# Patient Record
Sex: Female | Born: 1955 | Race: Black or African American | Hispanic: No | Smoking: Current every day smoker
Health system: Southern US, Community
[De-identification: ages and names within clinical notes are randomized; demographics above are authoritative.]

## PROBLEM LIST (undated history)

## (undated) DIAGNOSIS — T7840XA Allergy, unspecified, initial encounter: Secondary | ICD-10-CM

## (undated) DIAGNOSIS — J449 Chronic obstructive pulmonary disease, unspecified: Secondary | ICD-10-CM

## (undated) DIAGNOSIS — R011 Cardiac murmur, unspecified: Secondary | ICD-10-CM

## (undated) DIAGNOSIS — R0789 Other chest pain: Secondary | ICD-10-CM

## (undated) DIAGNOSIS — I10 Essential (primary) hypertension: Secondary | ICD-10-CM

## (undated) DIAGNOSIS — K219 Gastro-esophageal reflux disease without esophagitis: Secondary | ICD-10-CM

## (undated) DIAGNOSIS — E785 Hyperlipidemia, unspecified: Secondary | ICD-10-CM

## (undated) DIAGNOSIS — H269 Unspecified cataract: Secondary | ICD-10-CM

## (undated) HISTORY — PX: CHOLECYSTECTOMY: SHX55

## (undated) HISTORY — DX: Other chest pain: R07.89

## (undated) HISTORY — DX: Hyperlipidemia, unspecified: E78.5

## (undated) HISTORY — DX: Cardiac murmur, unspecified: R01.1

## (undated) HISTORY — DX: Chronic obstructive pulmonary disease, unspecified: J44.9

## (undated) HISTORY — DX: Unspecified cataract: H26.9

## (undated) HISTORY — DX: Allergy, unspecified, initial encounter: T78.40XA

---
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
## 1979-04-25 HISTORY — PX: ABDOMINAL HYSTERECTOMY: SHX81

## 1999-01-11 ENCOUNTER — Emergency Department (HOSPITAL_COMMUNITY): Admission: EM | Admit: 1999-01-11 | Discharge: 1999-01-11 | Payer: Self-pay | Admitting: Emergency Medicine

## 1999-04-22 ENCOUNTER — Emergency Department (HOSPITAL_COMMUNITY): Admission: EM | Admit: 1999-04-22 | Discharge: 1999-04-22 | Payer: Self-pay | Admitting: Emergency Medicine

## 1999-05-19 ENCOUNTER — Emergency Department (HOSPITAL_COMMUNITY): Admission: EM | Admit: 1999-05-19 | Discharge: 1999-05-19 | Payer: Self-pay | Admitting: Emergency Medicine

## 1999-11-08 ENCOUNTER — Other Ambulatory Visit: Admission: RE | Admit: 1999-11-08 | Discharge: 1999-11-08 | Payer: Self-pay | Admitting: Family Medicine

## 2000-07-10 ENCOUNTER — Encounter: Payer: Self-pay | Admitting: Family Medicine

## 2000-07-10 ENCOUNTER — Encounter: Admission: RE | Admit: 2000-07-10 | Discharge: 2000-07-10 | Payer: Self-pay | Admitting: Family Medicine

## 2002-08-27 ENCOUNTER — Encounter: Payer: Self-pay | Admitting: Emergency Medicine

## 2002-08-27 ENCOUNTER — Emergency Department (HOSPITAL_COMMUNITY): Admission: EM | Admit: 2002-08-27 | Discharge: 2002-08-27 | Payer: Self-pay | Admitting: Emergency Medicine

## 2006-04-25 ENCOUNTER — Emergency Department (HOSPITAL_COMMUNITY): Admission: EM | Admit: 2006-04-25 | Discharge: 2006-04-25 | Payer: Self-pay | Admitting: Family Medicine

## 2006-08-16 ENCOUNTER — Ambulatory Visit: Payer: Self-pay | Admitting: Obstetrics and Gynecology

## 2006-12-21 ENCOUNTER — Ambulatory Visit: Payer: Self-pay | Admitting: Internal Medicine

## 2006-12-21 ENCOUNTER — Ambulatory Visit: Payer: Self-pay | Admitting: *Deleted

## 2007-12-02 ENCOUNTER — Emergency Department (HOSPITAL_COMMUNITY): Admission: EM | Admit: 2007-12-02 | Discharge: 2007-12-02 | Payer: Self-pay | Admitting: Emergency Medicine

## 2008-10-23 ENCOUNTER — Emergency Department (HOSPITAL_COMMUNITY): Admission: EM | Admit: 2008-10-23 | Discharge: 2008-10-23 | Payer: Self-pay | Admitting: Emergency Medicine

## 2011-04-25 HISTORY — PX: COLONOSCOPY: SHX174

## 2011-08-09 ENCOUNTER — Ambulatory Visit: Payer: Self-pay | Admitting: Family Medicine

## 2011-08-10 ENCOUNTER — Ambulatory Visit: Payer: Self-pay | Admitting: Family Medicine

## 2011-08-18 ENCOUNTER — Encounter: Payer: Self-pay | Admitting: Family Medicine

## 2011-08-18 ENCOUNTER — Ambulatory Visit (INDEPENDENT_AMBULATORY_CARE_PROVIDER_SITE_OTHER): Payer: Self-pay | Admitting: Family Medicine

## 2011-08-18 VITALS — BP 154/91 | HR 89 | Ht 64.0 in | Wt 186.0 lb

## 2011-08-18 DIAGNOSIS — J42 Unspecified chronic bronchitis: Secondary | ICD-10-CM | POA: Insufficient documentation

## 2011-08-18 DIAGNOSIS — R011 Cardiac murmur, unspecified: Secondary | ICD-10-CM

## 2011-08-18 DIAGNOSIS — J309 Allergic rhinitis, unspecified: Secondary | ICD-10-CM | POA: Insufficient documentation

## 2011-08-18 DIAGNOSIS — E669 Obesity, unspecified: Secondary | ICD-10-CM

## 2011-08-18 DIAGNOSIS — R300 Dysuria: Secondary | ICD-10-CM

## 2011-08-18 LAB — POCT URINALYSIS DIPSTICK
Bilirubin, UA: NEGATIVE
Blood, UA: NEGATIVE
Glucose, UA: NEGATIVE
Ketones, UA: NEGATIVE
Leukocytes, UA: NEGATIVE
Nitrite, UA: NEGATIVE
Protein, UA: NEGATIVE
Spec Grav, UA: 1.02
Urobilinogen, UA: 1
pH, UA: 6

## 2011-08-18 MED ORDER — MOMETASONE FUROATE 50 MCG/ACT NA SUSP: 2.0000 | Freq: Every day | NASAL | Status: DC

## 2011-08-18 MED ORDER — METRONIDAZOLE 500 MG PO TABS: 500.0000 mg | ORAL_TABLET | Freq: Two times a day (BID) | ORAL | Status: AC

## 2011-08-18 MED ORDER — METRONIDAZOLE 500 MG PO TABS: 500.0000 mg | ORAL_TABLET | Freq: Two times a day (BID) | ORAL | Status: DC

## 2011-08-18 MED ORDER — ALBUTEROL SULFATE HFA 108 (90 BASE) MCG/ACT IN AERS: 2.0000 | INHALATION_SPRAY | RESPIRATORY_TRACT | Status: DC | PRN

## 2011-08-18 NOTE — Patient Instructions (Addendum)
It was very nice meeting you today!  For the bronchitis, I would like for you to get some testing of your lung function. To do this, you can call the clinic and make an appointment with Dr. Raymondo Band. He will also be able to help you with information about smoking cessation. It's very important for [place] to work on that.  I'm going to send in a prescription for albuterol to take as needed for shortness of breath.  For the nasal congestion and the allergies, I am sending a prescription for nasonex to take daily. You can also take zyrtec (cetirizine) or claritin (loratadine) every day.   For the pain on your right side, you can take aleve up to twice a day.   I'll be getting some blood work to check for cholesterol, kidney function and sugar. For that, call the clinic 1 -2 days before to make a lab appointment and come fasting: no food or drink other than water after midnight.   I'd like to see you back in 3 weeks, so that we can keep an eye on your blood pressure.

## 2011-08-19 NOTE — Progress Notes (Signed)
Patient ID: AARON BOEH, female   DOB: 05-31-1955, 56 y.o.   MRN: [id number]  HPI Sabrina [name] is a 56 y.o. female with h/o hyperlipidemia and chronic bronchitis who presents to establish care. 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 She feels like her shortness of breath has gotten worst in the last year or so. She becomes short of breath after walking for 1 block. She feels better when sitting. She has a chronic, non bloody, productive cough. She is not on any inhaler such as albuterol. She smokes 1.5pack per day and has smoked for 15 years. She denies any chest pain, orthopnea. - smoking cessation: She quit for 2 weeks after using chantix, but was around smokers at home which made quitting difficult. Currently lives with her daughter who smokes intermittently. She expresses the need to quit for her health and her trouble breathing. - allergic rhinitis: has rhinorrhea, sinus congestion. This is usually in relation to seasonal allergies. She doesn't take any medication for it. Was on nasonex in the past which helped her. Denies any headaches. - dysuria and vaginal odor: having some burning with urination. Also complains of fishy odor with urination. Denies any vaginal bleeding, abnormal discharge, vaginal itching or pain. - right flank musculoskeletal pain: pulling, throbbing pain. Feels like a knot. Has been feeling this way since 2010. No history of trauma to the area. No weakness, no loss of sensation, no urinary or bowel incontinence.    Past Medical History  Diagnosis Date  . Allergy   . Chronic bronchitis 1991  . Hyperlipidemia     not on any current medication. Had been on lipitor in the past  . Musculoskeletal chest pain     Past Surgical History  Procedure Date  . Abdominal hysterectomy 1981    removal of right ovary and uterus for infection of the  uterus  . Cholecystectomy     1979    Family History  Problem Relation Age of Onset  . Alzheimer's disease Mother   . Hyperlipidemia Mother   . Hypertension Mother   . Emphysema Father   . Alcohol abuse Sister   . Asthma Brother   . Cervical cancer Daughter   . Sickle cell anemia Other     Social History History  Substance Use Topics  . Smoking status: Current Everyday Smoker -- 1.5 packs/day    Types: Cigarettes  . Smokeless tobacco: Not on file  . Alcohol Use: Not on file    No Known Allergies  Current Outpatient Prescriptions  Medication Sig Dispense Refill  . albuterol (PROVENTIL HFA;VENTOLIN HFA) 108 (90 BASE) MCG/ACT inhaler Inhale 2 puffs into the lungs as needed for wheezing.  1 Inhaler  0  . metroNIDAZOLE (FLAGYL) 500 MG tablet Take 1 tablet (500 mg total) by mouth 2 (two) times daily. Take for 7 days  14 tablet  0  . mometasone (NASONEX) 50 MCG/ACT nasal spray Place 2 sprays into the nose daily.  17 g  12    Review of Systems Review of Systems  HENT: Positive for congestion, rhinorrhea and sneezing. Negative for ear pain.   Eyes: Positive for itching.  Respiratory: Positive for cough, shortness of breath and wheezing. Negative for chest tightness.   Cardiovascular: Negative for chest pain, palpitations and leg swelling.  Gastrointestinal: Negative for abdominal pain and blood in stool.  Genitourinary: Positive for dysuria. Negative for urgency,  frequency, hematuria, flank pain, vaginal bleeding, vaginal discharge and vaginal pain.  Musculoskeletal: Negative for arthralgias and gait problem.  Neurological: Negative for weakness and numbness.    Blood pressure 154/91, pulse 89, height 5\' 4"  (1.626 m), weight 186 lb (84.369 kg), SpO2 95.00%.  Physical Exam Physical Exam  Constitutional: She is oriented to person, place, and time. No distress.       obese  HENT:  Mouth/Throat: Oropharynx is clear and moist.       Inflamed nasal turbinates b/l  Eyes: EOM  are normal. Pupils are equal, round, and reactive to light. Right eye exhibits no discharge. Left eye exhibits no discharge.  Neck: Normal range of motion. Neck supple.  Cardiovascular: Normal rate and regular rhythm.   Murmur heard.      2/6 systolic ejection murmur best heard at right sternal border and radiation to the right neck  Pulmonary/Chest: Effort normal. She has no rales.       occasional expiratory wheezing in the anterior chest.   Abdominal: Soft. Bowel sounds are normal. She exhibits no distension and no mass. There is no tenderness. There is no guarding.  Musculoskeletal: Normal range of motion. She exhibits no edema and no tenderness.       No tenderness to palpation along spine or along supraspinal muscles. Mild discomfort with palpation of the right flank area.   Lymphadenopathy:    She has no cervical adenopathy.  Neurological: She is alert and oriented to person, place, and time.  Skin: Skin is warm and dry. No rash noted.  Psychiatric: She has a normal mood and affect.    Assessment    See problem list    Plan    See problem list       Marena Chancy 08/19/2011, 9:34 PM

## 2011-08-20 DIAGNOSIS — R011 Cardiac murmur, unspecified: Secondary | ICD-10-CM | POA: Insufficient documentation

## 2011-08-20 DIAGNOSIS — E669 Obesity, unspecified: Secondary | ICD-10-CM | POA: Insufficient documentation

## 2011-08-20 DIAGNOSIS — R300 Dysuria: Secondary | ICD-10-CM | POA: Insufficient documentation

## 2011-08-20 NOTE — Assessment & Plan Note (Signed)
Evidence of inflamed nasal turbinates on exam. No focal findings suggesting infection. Prescribed nasonex since patient mentioned this had helped her in the past. Also recommended daily antihistamine.

## 2011-08-20 NOTE — Assessment & Plan Note (Signed)
Chronic bronchitis vs COPD most likely partially plays a role in the shortness of breath and cough that patient is experiencing. Recommended that patient get PFT's to better define the pulmonary process she is experiencing. Also prescribed albuterol as needed, since wheezing could be appreciated on exam. Most importantly, strongly recommended that patient seriously think of quitting smoking since, as I explained, this is a HUGE part of the disease process. PAtient understands and hopes to be able to quit soon. PAtient can make appointment with pharmacy for smoking cessation. I will also readdress this at her next visit in 3 weeks.

## 2011-08-20 NOTE — Assessment & Plan Note (Signed)
With patient's history of hyperlipidemia, will check fasting lipid and CMP (also to check for fasting glucose). Patient's blood pressure was elevated in the office. Patient denies any history of elevated blood pressure. Therefore, instructed patient to take BP at drug store 2-3 times weekly and bring back log. Will start patient on anti-hypertensive at next visit, if BP consistently elevated.

## 2011-08-20 NOTE — Assessment & Plan Note (Signed)
Murmur on exam difficult to exactly define, although location at right sternal border with radiation to the neck could suggest an aortic stenosis type murmur. Will obtain echo at next visit. This could be a component of the shortness of breath she is experiencing, although copd most likely is a contributing factor as well. No cardiac red flags noted such as chest pain, edema.

## 2011-08-20 NOTE — Assessment & Plan Note (Addendum)
UA did not show any signs of infection. Patient had not been drinking a lot of fluids, which could be contributing to some of the discomfort with urination. With complaint of fishy odor, but with patient not having time to get vaginal exam, went ahead and empirically treated for potential BV with course of flagyl. If this continues being a problem at next visit, will perform speculum exam.

## 2011-09-11 ENCOUNTER — Encounter: Payer: Self-pay | Admitting: Pharmacist

## 2011-09-11 ENCOUNTER — Ambulatory Visit (INDEPENDENT_AMBULATORY_CARE_PROVIDER_SITE_OTHER): Payer: Self-pay | Admitting: Pharmacist

## 2011-09-11 DIAGNOSIS — J42 Unspecified chronic bronchitis: Secondary | ICD-10-CM

## 2011-09-11 DIAGNOSIS — J309 Allergic rhinitis, unspecified: Secondary | ICD-10-CM

## 2011-09-11 DIAGNOSIS — F17200 Nicotine dependence, unspecified, uncomplicated: Secondary | ICD-10-CM

## 2011-09-11 MED ORDER — VARENICLINE TARTRATE 1 MG PO TABS: 1.0000 mg | ORAL_TABLET | Freq: Two times a day (BID) | ORAL | Status: DC

## 2011-09-11 MED ORDER — VARENICLINE TARTRATE 0.5 MG X 11 & 1 MG X 42 PO MISC: ORAL | Status: DC

## 2011-09-11 NOTE — Progress Notes (Signed)
  Subjective:    Patient ID: Sabrina [name], female    DOB: 31-Jan-1956, 56 y.o.   MRN: [id number]  HPI  Patient arrived at the clinic today for a breathing test; indicated that two puffs of albuterol inhaler was used  prior to clinic visit. She was otherwise doing fine. A 39 year smoking history, currently smokes one pack a day, down from two packs a year two years ago. Also indicated that she is exposed to different chemicals (ammonia/chlorine bleach) from cleaning products. Endorses feeling tired when she smokes. Never used patches or lozenges to aid in quitting. Age when started using tobacco on a daily basis 16. Number of Cigarettes per day 21. Brand smoked [place] Gold 100. Estimated Nicotine Content per Cigarette (mg) 0.5-0.8 mg Estimated Nicotine intake per day 10-16 mg.   Smokes first cigarette <5 minutes after waking. Smokes 2-3 times per night.    Estimated Fagerstrom Score >6 /10.  Most recent quit attempt 10 years ago Longest time ever been tobacco free 2 weeks. What Medications (NRT, bupropion, varenicline) used in past includes varenicline.  Rates IMPORTANCE of quitting tobacco on 1-10 scale of 10. Rates READINESS of quitting tobacco on 1-10 scale of 5. Rates CONFIDENCE of quitting tobacco on 1-10 scale of 7-8. Triggers to use tobacco include; talking on the phone,  starting the car  Review of Systems     Objective:   Physical Exam  See Documentation Flowsheet (discrete results - PFTs) for complete Spirometry results. Patient provided good effort while attempting spirometry.   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 Initiated varenicline tx starting dose pack, counseled on purpose, proper use, and potential adverse effects, including GI upset, and potential change  in mood. Difficulty with paying for medication, counseled on registering with MAP for assistance with obtaining medication, working on reducing triggers, and increasing time to next smoke from 30 minutes to 60 minutes to help cut back on the number of cigarettes smoked per day. Written information provided. Provided information on 1 800-QUIT NOW support program, denies interest in using the [place] QUIT line.  F/U Rx Clinic Visit 10/17/2011 at 10:30 AM.   Total time in face-to-face counseling 60 minutes.  Patient seen with Melynda Ripple, PharmD. Candidate.   Spirometry evaluation reveals Moderate restrictive lung disease.  Patient has been experiencing shortness of breath and feeling like she has lost her breath for several years and taking albuterol for relief. Treatment plan at this time helping patient with attempt at smoking cessation.  Reviewed results of pulmonary function tests.  Pt verbalized understanding of results.  Written pt instructions provided.  F/U Clinic visit 10/17/11 at 10:30 AM.   Total time in face to face counseling 60 minutes.  Patient seen with Melynda Ripple, PharmD. Candidate.

## 2011-09-11 NOTE — Progress Notes (Signed)
Patient ID: Sabrina [name], female   DOB: 05/20/1955, 55 y.o.   MRN: [id number] Reviewed and agree with Dr. Koval's management. 

## 2011-09-11 NOTE — Patient Instructions (Addendum)
Congratulations on wanting to quit smoking! Go to the [employer] MAP office to register. It will help with your Chantix medication. Pick a quit date, and start taking the Chantix several days before your quit date Take Chantix with food to decrease nausea/vomiting. Work on recognizing your triggers: not waking up at night to smoke. Work on reducing the number of cigarettes you smoke a day Consider using the quit hotline to help with quitting.

## 2011-09-11 NOTE — Assessment & Plan Note (Signed)
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 Initiated varenicline tx starting dose pack, counseled on purpose, proper use, and potential adverse effects, including GI upset, and potential change in mood. Difficulty with paying for medication, counseled on registering with MAP for assistance with obtaining medication, working on reducing triggers, and increasing time to next smoke from 30 minutes to 60 minutes to help cut back on the number of cigarettes smoked per day. Written information provided. Provided information on 1 800-QUIT NOW support program, denies interest in using the [place] QUIT line.  F/U Rx Clinic Visit 10/17/2011 at 10:30 AM.   Total time in face-to-face counseling 60 minutes.  Patient seen with Melynda Ripple, PharmD. Candidate.   Spirometry evaluation reveals Moderate restrictive lung disease.  Patient has been experiencing shortness of breath and feeling like she has lost her breath for several years and taking albuterol for relief. Treatment plan at this time helping patient with attempt at smoking cessation.  Reviewed results of pulmonary function tests.  Pt verbalized understanding of results.  Written pt instructions provided.  F/U Clinic visit 10/17/11 at 10:30 AM.   Total time in face to face counseling 60 minutes.  Patient seen with Melynda Ripple, PharmD. Candidate.

## 2011-09-11 NOTE — Assessment & Plan Note (Signed)
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 Initiated varenicline tx starting dose pack, counseled on purpose, proper use, and potential adverse effects, including GI upset, and potential change in mood. Difficulty with paying for medication, counseled on registering with MAP for assistance with obtaining medication, working on reducing triggers, and increasing time to next smoke from 30 minutes to 60 minutes to help cut back on the number of cigarettes smoked per day. Written information provided. Provided information on 1 800-QUIT NOW support program, denies interest in using the [place] QUIT line.  F/U Rx Clinic Visit 10/17/2011 at 10:30 AM.   Total time in face-to-face counseling 60 minutes.  Patient seen with Amanda Tambe, PharmD. Candidate.   Spirometry evaluation reveals Moderate restrictive lung disease.  Patient has been experiencing shortness of breath and feeling like she has lost her breath for several years and taking albuterol for relief. Treatment plan at this time helping patient with attempt at smoking cessation.  Reviewed results of pulmonary function tests.  Pt verbalized understanding of results.  Written pt instructions provided.  F/U Clinic visit 10/17/11 at 10:30 AM.   Total time in face to face counseling 60 minutes.  Patient seen with Amanda Tambe, PharmD. Candidate. 

## 2011-09-19 ENCOUNTER — Ambulatory Visit (INDEPENDENT_AMBULATORY_CARE_PROVIDER_SITE_OTHER): Payer: Self-pay | Admitting: Family Medicine

## 2011-09-19 ENCOUNTER — Encounter: Payer: Self-pay | Admitting: Family Medicine

## 2011-09-19 VITALS — BP 142/91 | HR 84 | Ht 64.5 in | Wt 188.0 lb

## 2011-09-19 DIAGNOSIS — R011 Cardiac murmur, unspecified: Secondary | ICD-10-CM

## 2011-09-19 DIAGNOSIS — I10 Essential (primary) hypertension: Secondary | ICD-10-CM

## 2011-09-19 LAB — CBC WITH DIFFERENTIAL/PLATELET
Basophils Absolute: 0 10*3/uL (ref 0.0–0.1)
Basophils Relative: 0 % (ref 0–1)
Eosinophils Absolute: 0.2 10*3/uL (ref 0.0–0.7)
Eosinophils Relative: 2 % (ref 0–5)
HCT: 41 % (ref 36.0–46.0)
Hemoglobin: 14.3 g/dL (ref 12.0–15.0)
Lymphocytes Relative: 37 % (ref 12–46)
Lymphs Abs: 3.3 10*3/uL (ref 0.7–4.0)
MCH: 31.1 pg (ref 26.0–34.0)
MCHC: 34.9 g/dL (ref 30.0–36.0)
MCV: 89.1 fL (ref 78.0–100.0)
Monocytes Absolute: 0.7 10*3/uL (ref 0.1–1.0)
Monocytes Relative: 8 % (ref 3–12)
Neutro Abs: 4.8 10*3/uL (ref 1.7–7.7)
Neutrophils Relative %: 53 % (ref 43–77)
Platelets: 284 10*3/uL (ref 150–400)
RBC: 4.6 MIL/uL (ref 3.87–5.11)
RDW: 13.4 % (ref 11.5–15.5)
WBC: 9 10*3/uL (ref 4.0–10.5)

## 2011-09-19 LAB — LIPID PANEL
Cholesterol: 255 mg/dL — ABNORMAL HIGH (ref 0–200)
HDL: 41 mg/dL (ref >39)
LDL Cholesterol: 184 mg/dL — ABNORMAL HIGH (ref 0–99)
Total CHOL/HDL Ratio: 6.2 Ratio
Triglycerides: 150 mg/dL — ABNORMAL HIGH (ref <150)
VLDL: 30 mg/dL (ref 0–40)

## 2011-09-19 MED ORDER — HYDROCHLOROTHIAZIDE 12.5 MG PO TABS: 12.5000 mg | ORAL_TABLET | Freq: Every day | ORAL | Status: DC

## 2011-09-19 NOTE — Patient Instructions (Signed)
I am starting you on a low dose blood pressure medicine called Hydrochlorothiazide 12.5mg  to take once a day.  We'll check your cholesterol, your electrolytes and your blood count. I will call you with the results.   We're also getting a echo of your heart to check the murmur.

## 2011-09-20 LAB — COMPLETE METABOLIC PANEL WITH GFR
ALT: 19 U/L (ref 0–35)
AST: 19 U/L (ref 0–37)
Albumin: 4.1 g/dL (ref 3.5–5.2)
Alkaline Phosphatase: 109 U/L (ref 39–117)
BUN: 15 mg/dL (ref 6–23)
CO2: 24 mEq/L (ref 19–32)
Calcium: 9.9 mg/dL (ref 8.4–10.5)
Chloride: 109 mEq/L (ref 96–112)
Creat: 0.66 mg/dL (ref 0.50–1.10)
GFR, Est African American: >89 mL/min
GFR, Est Non African American: >89 mL/min
Glucose, Bld: 96 mg/dL (ref 70–99)
Potassium: 4.4 mEq/L (ref 3.5–5.3)
Sodium: 141 mEq/L (ref 135–145)
Total Bilirubin: 0.4 mg/dL (ref 0.3–1.2)
Total Protein: 7.6 g/dL (ref 6.0–8.3)

## 2011-09-21 NOTE — Assessment & Plan Note (Signed)
Will obtain echo to further evaluate. Will also obtain CBC to rule out anemia that could be causing flow murmur.

## 2011-09-21 NOTE — Assessment & Plan Note (Signed)
Stage 1 hypertension. Start HCTZ 12.5mg  daily. Check CMP, fasting lipid panel.

## 2011-09-21 NOTE — Progress Notes (Signed)
Patient ID: Sabrina [name], female   DOB: 03-16-1956, 56 y.o.   MRN: [id number] Patient ID: Sabrina [name]    DOB: October 08, 1955, 56 y.o.   MRN: [id number] --- Subjective:  Sabrina [name] is a 56 y.o.female who presents for follow up on hypertension Hypertension: BP at last office visit was elevated. Patient took BP at drug store and at clinic: 156/92, 140/90, 135/79. She denies any chest pain, shortness of breath, lower extremity swelling, change in vision or headache.   Shortness of breath: Was seen for PFT's and smoking cessation counseling. PFT's showed restrictive lung disease. Recommendations for management of this included smoking cessation. Patient has not yet applied for MAP program for chantix. She will soon do that. Shortness of breath is unchanged from previous.  ROS: see HPI Past Medical History: reviewed and updated medications and allergies. Social History: Tobacco:  Objective: Filed Vitals:   09/19/11 1440  BP: 142/91  Pulse: 84    Physical Examination:   General appearance - alert, well appearing, and in no distress Mouth - mucous membranes moist, decaying tooth on right side of mouth, no swelling or abscess. Neck - supple, no significant adenopathy Chest - coarse breath sounds with good air movement Heart - normal rate, regular rhythm, normal S1, S2,2/6 systolic murmur, rubs, clicks or gallops Abdomen - soft, nontender, nondistended, no masses or organomegaly Extremities - peripheral pulses normal, no pedal edema, no clubbing or cyanosis

## 2011-09-25 ENCOUNTER — Telehealth: Payer: Self-pay | Admitting: Family Medicine

## 2011-09-25 ENCOUNTER — Other Ambulatory Visit: Payer: Self-pay | Admitting: Family Medicine

## 2011-09-25 ENCOUNTER — Encounter: Payer: Self-pay | Admitting: Family Medicine

## 2011-09-25 DIAGNOSIS — E785 Hyperlipidemia, unspecified: Secondary | ICD-10-CM

## 2011-09-25 MED ORDER — PRAVASTATIN SODIUM 40 MG PO TABS: 40.0000 mg | ORAL_TABLET | Freq: Every day | ORAL | Status: DC

## 2011-09-25 NOTE — Telephone Encounter (Signed)
Called patient and told her about her elevated LDL at 184. Will start pravastatin 40mg  daily. She would like to get it through the MAP program. I wasn't sure if a generic would be covered by MAP. Will send Rx to preferred pharmacy [facility]) and will also print Rx for her to pick up at office tomorrow in case she needs a physical copy.

## 2011-09-25 NOTE — Telephone Encounter (Signed)
Placed hard copy of Rx at front desk in case patient needs it for MAP

## 2011-09-26 NOTE — Telephone Encounter (Signed)
Patient will pick up RX to take to [facility].  [facility] Lip[facility] 10 mg and the patient tells them she doesn't want to take Lipitor. Crystal from Map called about this.

## 2011-09-26 NOTE — Telephone Encounter (Signed)
Reviewed that patient will be bringing Rx for pravastatin at [facility]

## 2011-10-15 ENCOUNTER — Encounter (HOSPITAL_COMMUNITY): Payer: Self-pay | Admitting: Emergency Medicine

## 2011-10-15 ENCOUNTER — Emergency Department (HOSPITAL_COMMUNITY)
Admission: EM | Admit: 2011-10-15 | Discharge: 2011-10-15 | Disposition: A | Discharge status: Home / Self Care | Payer: Self-pay | Attending: Emergency Medicine | Admitting: Emergency Medicine

## 2011-10-15 DIAGNOSIS — Z90710 Acquired absence of both cervix and uterus: Secondary | ICD-10-CM | POA: Insufficient documentation

## 2011-10-15 DIAGNOSIS — I10 Essential (primary) hypertension: Secondary | ICD-10-CM | POA: Insufficient documentation

## 2011-10-15 DIAGNOSIS — R21 Rash and other nonspecific skin eruption: Secondary | ICD-10-CM | POA: Insufficient documentation

## 2011-10-15 DIAGNOSIS — F17200 Nicotine dependence, unspecified, uncomplicated: Secondary | ICD-10-CM | POA: Insufficient documentation

## 2011-10-15 DIAGNOSIS — E785 Hyperlipidemia, unspecified: Secondary | ICD-10-CM | POA: Insufficient documentation

## 2011-10-15 DIAGNOSIS — J42 Unspecified chronic bronchitis: Secondary | ICD-10-CM | POA: Insufficient documentation

## 2011-10-15 MED ORDER — DOXYCYCLINE HYCLATE 100 MG PO CAPS: 100.0000 mg | ORAL_CAPSULE | Freq: Two times a day (BID) | ORAL | Status: AC

## 2011-10-15 MED ORDER — PREDNISONE 10 MG PO TABS: ORAL_TABLET | ORAL | Status: AC

## 2011-10-15 MED ORDER — PREDNISONE 20 MG PO TABS
60.0000 mg | ORAL_TABLET | Freq: Once | ORAL | Status: AC
  Administered 2011-10-15: 60 mg via ORAL
  Filled 2011-10-15: qty 3

## 2011-10-15 NOTE — ED Notes (Signed)
PT. REPORTS ITCHY RASHES AT FACE ONSET YESTERDAY . RESPIRATIONS UNLABORED.

## 2011-10-15 NOTE — Discharge Instructions (Signed)
You were seen and evaluated today for your symptoms of rash. At this time providers are unsure what may have caused her rash. Providers feel this rash may be caused from an allergic reaction. You've been given prescriptions for steroid to help with your rash symptoms. You have also been given a prescription for antibiotics to use to prevent infection of the skin. Please take these as prescribed for the full length of time. You may also use Benadryl for your itching symptoms.   Allergic Reaction, Mild to Moderate Allergies may happen from anything your body is sensitive to. This may be food, medications, pollens, chemicals, and nearly anything around you in everyday life that produces allergens. An allergen is anything that causes an allergy producing substance. Allergens cause your body to release allergic antibodies. Through a chain of events, they cause a release of histamine into the blood stream. Histamines are meant to protect you, but they also cause your discomfort. This is why antihistamines are often used for allergies. Heredity is often a factor in causing allergic reactions. This means you may have some of the same allergies as your parents. Allergies happen in all age groups. You may have some idea of what caused your reaction. There are many allergens around [place]. It may be difficult to know what caused your reaction. If this is a first time event, it may never happen again. Allergies cannot be cured but can be controlled with medications. SYMPTOMS  You may get some or all of the following problems from allergies.  Swelling and itching in and around the mouth.   Tearing, itchy eyes.   Nasal congestion and runny nose.   Sneezing and coughing.   An itchy red rash or hives.   Vomiting or diarrhea.   Difficulty breathing.  Seasonal allergies occur in all age groups. They are seasonal because they usually occur during the same season every year. They may be a reaction to molds, grass  pollens, or tree pollens. Other causes of allergies are house dust mite allergens, pet dander and mold spores. These are just a common few of the thousands of allergens around [place]. All of the symptoms listed above happen when you come in contact with pollens and other allergens. Seasonal allergies are usually not life threatening. They are generally more of a nuisance that can often be handled using medications. Hay fever is a combination of all or some of the above listed allergy problems. It may often be treated with simple over-the-counter medications such as diphenhydramine. Take medication as directed. Check with your caregiver or package insert for child dosages. TREATMENT AND HOME CARE INSTRUCTIONS If hives or rash are present:  Take medications as directed.   You may use an over-the-counter antihistamine (diphenhydramine) for hives and itching as needed. Do not drive or drink alcohol until medications used to treat the reaction have worn off. Antihistamines tend to make people sleepy.   Apply cold cloths (compresses) to the skin or take baths in cool water. This will help itching. Avoid hot baths or showers. Heat will make a rash and itching worse.   If your allergies persist and become more severe, and over the counter medications are not effective, there are many new medications your caretaker can prescribe. Immunotherapy or desensitizing injections can be used if all else fails. Follow up with your caregiver if problems continue.  SEEK MEDICAL CARE IF:   Your allergies are becoming progressively more troublesome.   You suspect a food allergy. Symptoms generally happen  within 30 minutes of eating a food.   Your symptoms have not gone away within 2 days or are getting worse.   You develop new symptoms.   You want to retest yourself or your child with a food or drink you think causes an allergic reaction. Never test yourself or your child of a suspected allergy without being under the  watchful eye of your caregivers. A second exposure to an allergen may be life-threatening.  SEEK IMMEDIATE MEDICAL CARE IF:  You develop difficulty breathing or wheezing, or have a tight feeling in your chest or throat.   You develop a swollen mouth, hives, swelling, or itching all over your body.  A severe reaction with any of the above problems should be considered life-threatening. If you suddenly develop difficulty breathing call for local emergency medical help. THIS IS AN EMERGENCY. MAKE SURE YOU:   Understand these instructions.   Will watch your condition.   Will get help right away if you are not doing well or get worse.  Document Released: 02/05/2007 Document Revised: 03/30/2011 Document Reviewed: 02/05/2007 [facility] Patient Information 2012 [place], [place].    RESOURCE GUIDE  Chronic Pain Problems: Contact [facility]  [phone] Patients need to be referred by their primary care doctor.  Insufficient Money for Medicine: Contact United Way:  call "211" or [facility] 938-205-5976.  No Primary Care Doctor: - Call Health Connect  [phone] - can help you locate a primary care doctor that  accepts your insurance, provides certain services, etc. - Physician Referral [phone]  Agencies that provide inexpensive medical care: - [facility]  [phone] - [facility]  [phone] - [facility]  [phone] [facility]  [phone] - Planned Parenthood  [phone] [facility]  [phone]  Medicaid-accepting [facility] Providers: - [facility]- [address]  [phone], Mon-Fri 9am-7pm, Sat 9am-1pm - [facility]- [address] [place], [address] [place]  [phone] - [facility]- [address], [address] [place]  [phone] [facility]- [address]  [phone] - Renaye Rakers- [address]  [place], [address], [phone]  Only accepts [place] patients after they have their name  applied to their card  Self Pay (no insurance) in [place]: - Sickle Cell Patients: Dr Willey Blade, [facility]  [address] [place], [phone] - [facility]- [address]  [phone]       [facility] [place]- [address]       -     [facility]- see information above (Speak to [employer] if you do not have insurance)       -  [facility]- [address] [place], [phone]       -  [facility]- [address],  [phone]       -  [facility]- [address] [phone]       -  Dr Julio Sicks-  [address], [place], [phone]       -  [facility]- [address] [phone]       -  [facility]- [address], [phone], also [address] [phone]       -  [facility]- [address] [place], [phone], 1st & 3rd Saturday   every month, 10am-1pm  1) Find a Doctor and Pay Out of Pocket Although you won't have to find out who is covered by your insurance plan, it is a good idea to ask around and get recommendations. You will then need to call the office and see if the doctor you have chosen will accept you as a new patient and what types of options they offer for patients who are self-pay. Some doctors offer discounts or will set up payment plans for their patients who do not have insurance, but you will need to ask so you aren't surprised when you get to your appointment.  2) Contact Your Local Health Department Not all health departments have doctors that can see patients for sick visits, but many do, so it is worth a call to see if yours does. If you don't know where your local health department is, you can check in your phone book. The CDC also has a tool to help you locate your state's health department, and many state websites  also have listings of all of their local health departments.  3) Find a Walk-in Clinic If your illness is not likely to be very severe or complicated, you may want to try a walk in clinic. These are popping up all over the country in pharmacies, drugstores, and shopping centers. They're usually staffed by nurse practitioners or physician assistants that have been trained to treat common illnesses and complaints. They're usually fairly quick and inexpensive. However, if you have serious medical issues or chronic medical problems, these are probably not your best option  STD Testing - [facility] Department of [facility], STD Clinic, [address] [place], phone [phone] or [phone].  Monday - Friday, call for an appointment. [facility] of [employer], STD Clinic, [address], [place], phone [phone] or [phone].  Monday - Friday, call for an appointment.  Abuse/Neglect: [facility] Child Abuse Hotline 405-689-0543 [facility] Child Abuse Hotline (437)361-1754 (After Hours)  Emergency Shelter:  [facility] (870) [phone]  Maternity Homes: - [facility] 8548166803 - [facility] [phone]  MRSA Hotline #:   534-785-9881  [facility] Resources  [facility]  United Way [facility] Dept. [address]                 [address]         [address] [place] [address]  Blondell Reveal Phone:  [phone]                                  Phone:  [phone]                   Phone:  [phone]  [facility], [phone] - [facility] -  [facility]- [phone]       -     [facility] in [place], [address],                                  [phone],  [facility] Child Abuse Hotline 407 264 9628 or 9155055663 (After Hours)   Behavioral Health Services  Substance Abuse Resources: - [facility] Drug [facility]  [phone] - [facility] [phone] - [facility] [phone] Floydene Flock 4053094154 - Residential & Outpatient Substance Abuse Program  [phone]  Psychological Services: [facility]  [phone] [facility]  [phone] - [facility], [phone] [place]. [address], [place], ACCESS LINE: 412-769-6081 or (902)[phone], [employer]  Dental Assistance  If unable to pay or uninsured, contact:  [facility] or [facility]. to become qualified for the adult dental clinic.  Patients with Medicaid: [facility] [phone] [address], [phone] [address] [phone]  If unable to pay, or uninsured, contact [facility] 2512775281) or [facility] Department [phone] in [place], [phone] in [facility]) to become qualified for the adult dental clinic  Other Low-Cost Community Dental Services: - [facility]- [address] [place], [place], [place], [address], [phone], [address]. [address], 2nd and 4th Thursday of the month at 6:30am.  10 clients each day by appointment, can sometimes see walk-in patients if someone does not show for an appointment. [facility]- [address] [place], [place], [address], [phone] - [facility]- [address] [place], [place], [address], [phone] - [facility]- [phone] [facility]- [phone] [facility] Department- [phone]

## 2011-10-15 NOTE — ED Provider Notes (Signed)
Medical screening examination/treatment/procedure(s) were performed by non-physician practitioner and as supervising physician I was immediately available for consultation/collaboration.  Lynnwood Beckford, MD 10/15/11 0729 

## 2011-10-15 NOTE — ED Provider Notes (Signed)
History     CSN: [id number]  Arrival date & time 10/15/11  0126   First MD Initiated Contact with Patient 10/15/11 0141      Chief Complaint  Patient presents with  . Rash   HPI  History provided by the patient. Patient is a 56 year old female with history of hyperlipidemia, hypertension who presents with complaints of rash to her face and legs. Patient states she began having slight itching to left side of her face yesterday afternoon. Patient is unsure what causes a rash but think she could bend mosquitoes or possibly from her dog. She does report letting her.route around outside. She denies any previous history of poison ivy please note rashes. Patient has no news lotions, soaps, perfumes or makeup. For rash as described as pruritic. There is slight burning and increased redness today. Patient denies any bleeding or discharge. She denies any fever, chills, sweats. Patient denies any difficulty breathing.    Past Medical History  Diagnosis Date  . Allergy   . Chronic bronchitis 1991  . Hyperlipidemia     not on any current medication. Had been on lipitor in the past  . Musculoskeletal chest pain     Past Surgical History  Procedure Date  . Abdominal hysterectomy 1981    removal of right ovary and uterus for infection of the uterus  . Cholecystectomy     1979    Family History  Problem Relation Age of Onset  . Alzheimer's disease Mother   . Hyperlipidemia Mother   . Hypertension Mother   . Emphysema Father   . Alcohol abuse Sister   . Asthma Brother   . Cervical cancer Daughter   . Sickle cell anemia Other     History  Substance Use Topics  . Smoking status: Current Everyday Smoker -- 1.5 packs/day    Types: Cigarettes  . Smokeless tobacco: Not on file  . Alcohol Use: Yes    OB History    Grav Para Term Preterm Abortions TAB SAB Ect Mult Living                  Review of Systems  Constitutional: Negative for fever and chills.  Respiratory: Negative for  shortness of breath.   Skin: Positive for rash.    Allergies  Review of patient's allergies indicates no known allergies.  Home Medications   Current Outpatient Rx  Name Route Sig Dispense Refill  . ALBUTEROL SULFATE HFA 108 (90 BASE) MCG/ACT IN AERS Inhalation Inhale 2 puffs into the lungs as needed. For wheezing/shortness of breath    . CETIRIZINE HCL 10 MG PO TABS Oral Take 1 tablet (10 mg total) by mouth daily.    Marland Kitchen HYDROCHLOROTHIAZIDE 25 MG PO TABS Oral Take 25 mg by mouth daily.    . MOMETASONE FUROATE 50 MCG/ACT NA SUSP Nasal Place 2 sprays into the nose daily. 17 g 12  . NAPROXEN SODIUM 220 MG PO TABS Oral Take 440 mg by mouth 2 (two) times daily as needed. For pain    . PRAVASTATIN SODIUM 40 MG PO TABS Oral Take 1 tablet (40 mg total) by mouth daily. 90 tablet 3    BP 139/86  Pulse 102  Temp 98.2 F (36.8 C) (Oral)  Resp 18  SpO2 94%  Physical Exam  Nursing note and vitals reviewed. Constitutional: She is oriented to person, place, and time. She appears well-developed and well-nourished. No distress.  HENT:  Head: Normocephalic.  Cardiovascular: Normal rate and regular rhythm.  Pulmonary/Chest: Effort normal and breath sounds normal.  Neurological: She is alert and oriented to person, place, and time.  Skin: Skin is warm and dry. Rash noted.       Erythematous papular rash to bilateral face and chin area. There is some areas of dryness with slight tenderness. Secondary excoriations are present. A few macular erythematous spots of left lower leg.  Psychiatric: She has a normal mood and affect. Her behavior is normal.    ED Course  Procedures       1. Rash       MDM  Patient seen and evaluated. Patient in no acute distress.        Angus Seller, [place] 10/15/11 [phone]

## 2011-10-17 ENCOUNTER — Ambulatory Visit (INDEPENDENT_AMBULATORY_CARE_PROVIDER_SITE_OTHER): Payer: Self-pay | Admitting: Pharmacist

## 2011-10-17 ENCOUNTER — Encounter: Payer: Self-pay | Admitting: Pharmacist

## 2011-10-17 VITALS — BP 124/74 | HR 88 | Ht 64.0 in | Wt 188.1 lb

## 2011-10-17 DIAGNOSIS — F17200 Nicotine dependence, unspecified, uncomplicated: Secondary | ICD-10-CM

## 2011-10-17 NOTE — Patient Instructions (Addendum)
1. Try to get down to 10 cigarettes a day within the next two weeks and begin the chantix when you are able to get it 2. Try to pick a moment in the day to do fun things that you enjoy to prevent you from smoking when you are used to (like after meals) 3. Try not to take Aleve everyday. Take tylenol extra-strength 500mg , 2 tablets twice a day on the days where you aren't too busy and are in less pain.

## 2011-10-17 NOTE — Progress Notes (Signed)
  Subjective:    Patient ID: Sabrina [name], female    DOB: 30-Aug-1955, 56 y.o.   MRN: [id number]  HPI Patient presents to clinic for a follow up appointment for tobacco cessation. She is in good spirits and verbalizes her excitement in cutting down to 15-16 cigarettes/day (<1 ppd). She notices that she is less tired and her breathing has improved, needing little to no albuterol. She no longer smokes in the middle of the night but triggers still include boredom, stress, meals, and when drinking beer. She is to pick up her varenicline medication in the next week or so when the prescription is ready.   She notes that she is having hip pain on days where she is running a lot of errands and has been taking naproxen on a daily basis.    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 She is to initiate varenicline within a week when the prescription is ready. Patient counseled on purpose, proper use, and potential adverse effects, including GI upset, and potential change in mood.   Written information provided. Provided information on 1 800-QUIT NOW support program. She verbalizes that she is going to try to do find activities that take her mind off smoking.   In regards to her pain, it was recommended that she not take naproxen everyday but rather to take extra-strength tylenol on days where she is too busy and the pain is minimal. F/U Rx Clinic Visit November 21, 2011.   Total time in face-to-face counseling 20 minutes.  Patient seen with Brett Fairy, PharmD, Pharmacy Resident.

## 2011-10-17 NOTE — Assessment & Plan Note (Signed)
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 She is to initiate varenicline within a week when the prescription is ready. Patient counseled on purpose, proper use, and potential adverse effects, including GI upset, and potential change in mood.   Written information provided. Provided information on 1 800-QUIT NOW support program. She verbalizes that she is going to try to do find activities that take her mind off smoking.  Total time in face-to-face counseling 20 minutes.  Patient seen with Brett Fairy, PharmD, Pharmacy Resident.

## 2011-10-18 NOTE — Progress Notes (Signed)
Patient ID: Sabrina [name], female   DOB: 02/02/1956, 55 y.o.   MRN: [id number] Reviewed and agree with Dr. Koval's management. 

## 2011-10-25 ENCOUNTER — Ambulatory Visit (INDEPENDENT_AMBULATORY_CARE_PROVIDER_SITE_OTHER): Payer: Self-pay | Admitting: Family Medicine

## 2011-10-25 ENCOUNTER — Encounter: Payer: Self-pay | Admitting: Family Medicine

## 2011-10-25 VITALS — BP 140/85 | HR 81 | Temp 98.3°F | Ht 64.0 in | Wt 187.6 lb

## 2011-10-25 DIAGNOSIS — F17200 Nicotine dependence, unspecified, uncomplicated: Secondary | ICD-10-CM

## 2011-10-25 DIAGNOSIS — M549 Dorsalgia, unspecified: Secondary | ICD-10-CM

## 2011-10-25 DIAGNOSIS — Z0000 Encounter for general adult medical examination without abnormal findings: Secondary | ICD-10-CM

## 2011-10-25 DIAGNOSIS — K0889 Other specified disorders of teeth and supporting structures: Secondary | ICD-10-CM

## 2011-10-25 DIAGNOSIS — I10 Essential (primary) hypertension: Secondary | ICD-10-CM

## 2011-10-25 DIAGNOSIS — K089 Disorder of teeth and supporting structures, unspecified: Secondary | ICD-10-CM

## 2011-10-25 DIAGNOSIS — Z1211 Encounter for screening for malignant neoplasm of colon: Secondary | ICD-10-CM

## 2011-10-25 DIAGNOSIS — R011 Cardiac murmur, unspecified: Secondary | ICD-10-CM

## 2011-10-25 MED ORDER — MOMETASONE FUROATE 50 MCG/ACT NA SUSP: 2.0000 | Freq: Every day | NASAL | Status: DC

## 2011-10-25 MED ORDER — VARENICLINE TARTRATE 0.5 MG PO TABS: 0.5000 mg | ORAL_TABLET | Freq: Two times a day (BID) | ORAL | Status: DC

## 2011-10-25 MED ORDER — DESLORATADINE 5 MG PO TABS: 5.0000 mg | ORAL_TABLET | Freq: Every day | ORAL | Status: DC

## 2011-10-25 NOTE — Patient Instructions (Addendum)
For the tooth pain, I'm going to start you on penicillin antibiotic for 10 days and hopefully the dental clinic will have an appointment soon.  For the muscle pain, I'm starting flexeril to take at bedtime (it'll make yu drowsy). You can also take ibuprofen 600mg  total: 3x 200mg  tab every 6 hours as needed for pain.   I'll give you the info for the mammogram.   I'd like to get a repeat of the cholsterol in 1-2 weeks.   For the blood pressure medicine, take 1 full tab (25mg )  For the chantix:  Initial: Days 1-3: 0.5 mg once daily Days 4-7: 0.5 mg twice daily Maintenance (? Day 8): U.S. labeling: 1 mg twice daily for 11 weeks

## 2011-10-27 ENCOUNTER — Other Ambulatory Visit: Payer: Self-pay | Admitting: Family Medicine

## 2011-10-27 DIAGNOSIS — M549 Dorsalgia, unspecified: Secondary | ICD-10-CM | POA: Insufficient documentation

## 2011-10-27 DIAGNOSIS — K0889 Other specified disorders of teeth and supporting structures: Secondary | ICD-10-CM | POA: Insufficient documentation

## 2011-10-27 MED ORDER — PENICILLIN V POTASSIUM 500 MG PO TABS: 500.0000 mg | ORAL_TABLET | Freq: Three times a day (TID) | ORAL | Status: DC

## 2011-10-27 MED ORDER — PENICILLIN V POTASSIUM 500 MG PO TABS: 500.0000 mg | ORAL_TABLET | Freq: Three times a day (TID) | ORAL | Status: AC

## 2011-10-27 MED ORDER — CYCLOBENZAPRINE HCL 10 MG PO TABS: 10.0000 mg | ORAL_TABLET | Freq: Every evening | ORAL | Status: DC | PRN

## 2011-10-27 MED ORDER — CYCLOBENZAPRINE HCL 10 MG PO TABS: 10.0000 mg | ORAL_TABLET | Freq: Every evening | ORAL | Status: AC | PRN

## 2011-10-27 NOTE — Assessment & Plan Note (Signed)
Start chantix

## 2011-10-27 NOTE — Assessment & Plan Note (Signed)
Appointment scheduled for patient to have echo

## 2011-10-27 NOTE — Progress Notes (Signed)
Patient ID: Sabrina [name], female   DOB: 08-Dec-1955, 56 y.o.   MRN: [id number] Patient ID: Sabrina [name]    DOB: 30-Apr-1955, 56 y.o.   MRN: [id number] --- Subjective:  Sabrina [name] is a 56 y.o.female with h/o HTN, chronic bronchitis and obesity who presents for follow up appointment.   Tooth pain:  Right top molar worsening in pain since her last visit here, tooth pain for several months now. 10/10 at times. Sharp shooting. No associated headache. Hard to eat on that side. No fever. No chills. Has not used any pain medications for it.   Right sided flank pain: Feels a knot on her side. Worst with some movement. Intermittent. achey type pain. No recent trauma. Has lasted for a few weeks now. No urine or bowel incontinence. No fevers or chills.   Smoking cessation: Cutting back from 2 packs to 1 pack. Says she sometimes feels nauseated with it. Would like to start chantix for smoking cessation.    Health maintenance: Requests colonoscopy; has never had one Requests mammogram: denies any lumps or bumps or breast changes. Last mammogram 4 years ago.   ROS: see HPI Past Medical History: reviewed and updated medications and allergies. Social History: Tobacco:  Objective: Filed Vitals:   10/25/11 1452  BP: 140/85  Pulse: 81  Temp: 98.3 F (36.8 C)    Physical Examination:   General appearance - alert, well appearing, and in no distress Nose - normal and patent, erythematous and congested nasal turbinates Mouth - right top molar: decay around tooth, tooth loose and tender with palpation, gingival swelling surrounding tooth Neck - supple, no significant adenopathy Chest - clear to auscultation, no wheezes, rales or rhonchi, symmetric air entry Heart - normal rate, regular rhythm, normal S1, S2, 2/6 systolic murmur, rubs, clicks or gallops Abdomen - soft, nontender, nondistended, no masses or organomegaly Extremities - peripheral pulses normal, no pedal edema, no clubbing or cyanosis Back -   no CVA tenderness, no tenderness along spinous process, muscle spasm present along mid paraspinal muscles, tenderness associated with it   A/P: Health maintenance: Gave patient information about mammogram scholarship Colonoscopy: patient on orange card which may be difficult to obtain colonoscopy. Will obtain fecal occults at home in the meantime.

## 2011-10-27 NOTE — Assessment & Plan Note (Signed)
Currently taking HCTZ 12.5mg . Given BP at 140/85, will increase to 25mg  daily.

## 2011-10-27 NOTE — Assessment & Plan Note (Signed)
Right top molar shows some evidence of infection. Given worsening symptoms, will start patient on penicillin V K 500mg  tid for 10 days. Will also send referral for dentist.

## 2011-10-27 NOTE — Assessment & Plan Note (Signed)
Start nasonex and clarinex

## 2011-10-27 NOTE — Assessment & Plan Note (Signed)
Secondary to muscle spasm. Continue ibuprofen. Start short course of flexeril 10mg  qhs.

## 2011-10-31 ENCOUNTER — Other Ambulatory Visit: Payer: Self-pay | Admitting: Family Medicine

## 2011-11-01 ENCOUNTER — Telehealth: Payer: Self-pay | Admitting: *Deleted

## 2011-11-01 ENCOUNTER — Telehealth: Payer: Self-pay | Admitting: Family Medicine

## 2011-11-01 NOTE — Telephone Encounter (Signed)
See next telephone note from Dr. Gwenlyn Saran.

## 2011-11-01 NOTE — Telephone Encounter (Signed)
Called and spoke with pharmacist and gave rx request per Dr. Gwenlyn Saran. For Chantix

## 2011-11-01 NOTE — Telephone Encounter (Signed)
[facility] and left a message on answering service for clarification of the chantix prescription. Rx for 0.5mg  once daily from days 1-3, followed by 0.5mg  bid for days 4-7, followed by 1mg  bid for 11 weeks of treatment.

## 2011-11-01 NOTE — Telephone Encounter (Signed)
[facility]  pharmacy  called about RX for Chantix.   They are wondering if this is a starter dose for patient or if she jf has already  started  and is just continuing,  because this is not the usual dose for either. .  Will forward to MD.  Can call back [phone] or let me know and I will call back.

## 2011-11-02 ENCOUNTER — Ambulatory Visit (HOSPITAL_COMMUNITY)
Admission: RE | Admit: 2011-11-02 | Discharge: 2011-11-02 | Disposition: A | Discharge status: Home / Self Care | Payer: Self-pay | Source: Ambulatory Visit | Attending: Family Medicine | Admitting: Family Medicine

## 2011-11-02 DIAGNOSIS — R011 Cardiac murmur, unspecified: Secondary | ICD-10-CM | POA: Insufficient documentation

## 2011-11-02 DIAGNOSIS — R0989 Other specified symptoms and signs involving the circulatory and respiratory systems: Secondary | ICD-10-CM | POA: Insufficient documentation

## 2011-11-02 DIAGNOSIS — I517 Cardiomegaly: Secondary | ICD-10-CM

## 2011-11-02 DIAGNOSIS — R0609 Other forms of dyspnea: Secondary | ICD-10-CM | POA: Insufficient documentation

## 2011-11-02 DIAGNOSIS — I10 Essential (primary) hypertension: Secondary | ICD-10-CM | POA: Insufficient documentation

## 2011-11-02 DIAGNOSIS — F17200 Nicotine dependence, unspecified, uncomplicated: Secondary | ICD-10-CM | POA: Insufficient documentation

## 2011-11-02 NOTE — Progress Notes (Signed)
  Echocardiogram 2D Echocardiogram has been performed.  Sabrina [name] 11/02/2011, 3:12 PM

## 2011-11-03 ENCOUNTER — Other Ambulatory Visit: Payer: Self-pay

## 2011-11-03 DIAGNOSIS — R300 Dysuria: Secondary | ICD-10-CM

## 2011-11-03 LAB — COMPREHENSIVE METABOLIC PANEL
ALT: 22 U/L (ref 0–35)
AST: 21 U/L (ref 0–37)
Albumin: 4.1 g/dL (ref 3.5–5.2)
Alkaline Phosphatase: 122 U/L — ABNORMAL HIGH (ref 39–117)
BUN: 12 mg/dL (ref 6–23)
CO2: 25 mEq/L (ref 19–32)
Calcium: 10.4 mg/dL (ref 8.4–10.5)
Chloride: 100 mEq/L (ref 96–112)
Creat: 0.74 mg/dL (ref 0.50–1.10)
Glucose, Bld: 84 mg/dL (ref 70–99)
Potassium: 4.5 mEq/L (ref 3.5–5.3)
Sodium: 137 mEq/L (ref 135–145)
Total Bilirubin: 0.5 mg/dL (ref 0.3–1.2)
Total Protein: 7.7 g/dL (ref 6.0–8.3)

## 2011-11-03 LAB — LIPID PANEL
Cholesterol: 170 mg/dL (ref 0–200)
HDL: 34 mg/dL — ABNORMAL LOW (ref >39)
LDL Cholesterol: 102 mg/dL — ABNORMAL HIGH (ref 0–99)
Total CHOL/HDL Ratio: 5 Ratio
Triglycerides: 169 mg/dL — ABNORMAL HIGH (ref <150)
VLDL: 34 mg/dL (ref 0–40)

## 2011-11-03 NOTE — Progress Notes (Signed)
CMP AND FLP DONE TODAY Velma Hanna 

## 2011-11-04 ENCOUNTER — Encounter: Payer: Self-pay | Admitting: Family Medicine

## 2011-11-09 LAB — HEMOCCULT GUIAC POC 1CARD (OFFICE)
Card #2 Fecal Occult Blod, POC: POSITIVE
Card #3 Fecal Occult Blood, POC: POSITIVE
Fecal Occult Blood, POC: NEGATIVE

## 2011-11-09 NOTE — Addendum Note (Signed)
Addended by: [place], Abron Neddo on: 11/09/2011 10:15 AM   Modules accepted: Orders

## 2011-11-13 ENCOUNTER — Telehealth: Payer: Self-pay | Admitting: *Deleted

## 2011-11-13 NOTE — Telephone Encounter (Signed)
Message copied by Arlyss Repress on Mon Nov 13, 2011  9:17 AM ------      Message from: Hillis Range, [place]      Created: Fri Nov 10, 2011 10:06 PM      Regarding: abnormal lab results.       Please call pt and let her know she needs to schedule an appointment with Dr. Gwenlyn Saran to discuss results of recent labs (fecal occult blood).      Thanks, DP

## 2011-11-13 NOTE — Telephone Encounter (Signed)
Called pt and she will sched. OV with Dr.Losq to discuss pos stool cards.  Lorenda Hatchet, Renato Battles

## 2011-11-16 ENCOUNTER — Ambulatory Visit (INDEPENDENT_AMBULATORY_CARE_PROVIDER_SITE_OTHER): Payer: Self-pay | Admitting: Family Medicine

## 2011-11-16 ENCOUNTER — Telehealth: Payer: Self-pay | Admitting: Family Medicine

## 2011-11-16 ENCOUNTER — Encounter: Payer: Self-pay | Admitting: Family Medicine

## 2011-11-16 VITALS — BP 118/84 | HR 82 | Temp 98.6°F | Ht 64.0 in | Wt 184.0 lb

## 2011-11-16 DIAGNOSIS — I10 Essential (primary) hypertension: Secondary | ICD-10-CM

## 2011-11-16 DIAGNOSIS — R195 Other fecal abnormalities: Secondary | ICD-10-CM

## 2011-11-16 DIAGNOSIS — Z86010 Personal history of colonic polyps: Secondary | ICD-10-CM | POA: Insufficient documentation

## 2011-11-16 MED ORDER — HYDROCHLOROTHIAZIDE 25 MG PO TABS: 25.0000 mg | ORAL_TABLET | Freq: Every day | ORAL | Status: DC

## 2011-11-16 MED ORDER — FLUTICASONE-SALMETEROL 100-50 MCG/DOSE IN AEPB: 1.0000 | INHALATION_SPRAY | Freq: Two times a day (BID) | RESPIRATORY_TRACT | Status: DC

## 2011-11-16 MED ORDER — ALBUTEROL SULFATE HFA 108 (90 BASE) MCG/ACT IN AERS: 2.0000 | INHALATION_SPRAY | RESPIRATORY_TRACT | Status: DC | PRN

## 2011-11-16 NOTE — Assessment & Plan Note (Signed)
Not well controled. Only on rescue medication. Encouraged continued smoking cessation. Patient will be receiving chantix through [facility] as this has worked for her in the past. Will start her on advair for controled medicine.

## 2011-11-16 NOTE — Progress Notes (Signed)
Patient ID: Sabrina [name], female   DOB: 26-Oct-1955, 56 y.o.   MRN: [id number] Patient ID: Sabrina [name]    DOB: 11-20-1955, 56 y.o.   MRN: [id number] --- Subjective:  Sabrina [name] is a 56 y.o.female with h/o chronic bronchitis, hypertension who presents for follow up on results of stool cards.  Patient had 2/3 positive heme occult. She denies any straining with bowel movements. Denies any recent NSAID use. She denies any family history of colon cancer.  No recent weight loss. No constipation or abdominal pain.   Bronchitis: Uses albuterol inhaler up to 3 times a day when she is more upset. No night time use. No active wheezing or shortness of breath.  ROS: see HPI Past Medical History: reviewed and updated medications and allergies. Social History: Tobacco: current smoker 1.5 pack per day  Objective: Filed Vitals:   11/16/11 0839  BP: 118/84  Pulse: 82  Temp: 98.6 F (37 C)    Physical Examination:   General appearance - alert, well appearing, and in mild distress given results, tearful at times.  Chest - clear to auscultation, no wheezes, rales or rhonchi, symmetric air entry Heart - normal rate, regular rhythm, normal S1, S2, no murmurs, rubs, clicks or gallops Abdomen - soft, nontender, nondistended, no masses or organomegaly Extremities - peripheral pulses normal, no pedal edema, no clubbing or cyanosis

## 2011-11-16 NOTE — Telephone Encounter (Signed)
Please fax copy of [facility] card in system to [facility] for patient.  She talked with someone there and they told her we had to make the referral.

## 2011-11-16 NOTE — Telephone Encounter (Signed)
[place] card faxed today.Busick, Rodena Medin

## 2011-11-16 NOTE — Assessment & Plan Note (Signed)
Controled on hctz 25mg  daily. BMP from 11/03/11 showing normal K. Continue current management.

## 2011-11-16 NOTE — Assessment & Plan Note (Signed)
Will send referral for GI through [facility]. Payment will be an issue. Patient will also be going to social services offices to inquire about possibility for medicaid.  High rate of false positive with fecal occult screening, but would prefer to get colonoscopy to exclude any malignancy.

## 2011-11-16 NOTE — Patient Instructions (Addendum)
For the positive stool cards, we are going to send a referral to [facility] and [facility] and take it from there.

## 2011-11-20 ENCOUNTER — Telehealth: Payer: Self-pay | Admitting: *Deleted

## 2011-11-20 NOTE — Telephone Encounter (Signed)
Called and informed her that [facility] considers her to be "self pay" and she will have to pay $184.00 out of pocket and she will then have to set up payment arrangements through their billing office. Pt is also inquiring about a letter that she asked Dr. Gwenlyn Saran to write for her and a dental referral. Pt stated that she asked Dr. Gwenlyn Saran to write a letter for her to take into social services stating her health issues to assist her with obtaining medicaid.  I told her it was sent in on July 5th and they only take 10 patients per month and it takes a few months for them to get pt's in. Told her that I would forward this to her pcp. Pt voiced understanding and agreed.Loralee Pacas [place]

## 2011-11-21 ENCOUNTER — Telehealth: Payer: Self-pay | Admitting: Family Medicine

## 2011-11-21 ENCOUNTER — Ambulatory Visit (INDEPENDENT_AMBULATORY_CARE_PROVIDER_SITE_OTHER): Payer: Self-pay | Admitting: Pharmacist

## 2011-11-21 ENCOUNTER — Encounter: Payer: Self-pay | Admitting: Family Medicine

## 2011-11-21 ENCOUNTER — Encounter: Payer: Self-pay | Admitting: Pharmacist

## 2011-11-21 VITALS — BP 148/85 | HR 86 | Ht 63.5 in | Wt 185.3 lb

## 2011-11-21 DIAGNOSIS — F17200 Nicotine dependence, unspecified, uncomplicated: Secondary | ICD-10-CM

## 2011-11-21 MED ORDER — VARENICLINE TARTRATE 0.5 MG X 11 & 1 MG X 42 PO MISC: ORAL | Status: DC

## 2011-11-21 MED ORDER — VARENICLINE TARTRATE 1 MG PO TABS: 1.0000 mg | ORAL_TABLET | Freq: Two times a day (BID) | ORAL | Status: DC

## 2011-11-21 NOTE — Progress Notes (Signed)
Patient ID: Sabrina [name], female   DOB: [birth year], 56 y.o.   MRN: [id number] Reviewed and agree with Dr. Macky Lower management.

## 2011-11-21 NOTE — Assessment & Plan Note (Signed)
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    Plan will be to initiate varenicline tx as previously discussed when it arrives.  Hopefully this will be started in the next 1-2 weeks. Patient counseled on purpose, proper use, and potential adverse effects, including GI upset, and potential change in mood.   Written information provided.  F/U Rx Clinic Visit in 2 weeks..   Total time in face-to-face counseling .

## 2011-11-21 NOTE — Progress Notes (Addendum)
  Subjective:    Patient ID: Sabrina [name], female    DOB: 1955/05/17, 56 y.o.   MRN: [id number]  HPI Patient arrives today stating "everything is the same"  She has NOT yet received her Chantix (varenicline) from [facility].   She is planning to stop by there site tomorrow to check on status of request.  She will communicate needs to our office if she needs additional suport to obtain Chantix.  Overall she continue to be committed to quit attempt with Chantix.  Her level of readiness and confidence continue to be 10/10.   She is concerned that she has been told she needs a colonoscopy AND cannot afford to pay for this test out of pocket.   She is working through the process of attempting to get Medicaid.  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    Plan will be to initiate varenicline tx as previously discussed when it arrives.  Hopefully this will be started in the next 1-2 weeks. Patient counseled on purpose, proper use, and potential adverse effects, including GI upset, and potential change in mood.   Written information provided.  F/U Rx Clinic Visit in 2 weeks..   Total time in face-to-face counseling .   Patient requested new RX for Chantix to be sent to [facility] as she states they need new paperwork to process. Done via Phone to Voice Mail recording at [facility]

## 2011-11-21 NOTE — Telephone Encounter (Signed)
Called patient to let her know that letter is ready for her to pick up. Also am prescribing advair as controller medicine for chronic advair. Patient agreed.

## 2011-11-21 NOTE — Patient Instructions (Addendum)
Take your Chantix when you get it from [facility]   Please follow up in Pharmacy clinic is a few weeks and we can discuss details to your quit plan.

## 2011-12-05 ENCOUNTER — Ambulatory Visit: Payer: Self-pay | Admitting: Pharmacist

## 2011-12-11 ENCOUNTER — Ambulatory Visit: Payer: Self-pay | Admitting: Pharmacist

## 2011-12-21 ENCOUNTER — Ambulatory Visit: Payer: Self-pay | Admitting: Pharmacist

## 2012-01-05 ENCOUNTER — Other Ambulatory Visit: Payer: Self-pay | Admitting: Family Medicine

## 2012-01-05 DIAGNOSIS — Z1231 Encounter for screening mammogram for malignant neoplasm of breast: Secondary | ICD-10-CM

## 2012-01-25 ENCOUNTER — Ambulatory Visit (HOSPITAL_COMMUNITY)
Admission: RE | Admit: 2012-01-25 | Discharge: 2012-01-25 | Disposition: A | Discharge status: Home / Self Care | Payer: Self-pay | Source: Ambulatory Visit | Attending: Family Medicine | Admitting: Family Medicine

## 2012-01-25 DIAGNOSIS — Z1231 Encounter for screening mammogram for malignant neoplasm of breast: Secondary | ICD-10-CM

## 2012-04-04 ENCOUNTER — Ambulatory Visit (INDEPENDENT_AMBULATORY_CARE_PROVIDER_SITE_OTHER): Payer: No Typology Code available for payment source | Admitting: Family Medicine

## 2012-04-04 ENCOUNTER — Encounter: Payer: Self-pay | Admitting: Family Medicine

## 2012-04-04 VITALS — BP 142/85 | HR 93 | Ht 63.5 in | Wt 191.2 lb

## 2012-04-04 DIAGNOSIS — I10 Essential (primary) hypertension: Secondary | ICD-10-CM

## 2012-04-04 MED ORDER — NICOTINE 21 MG/24HR TD PT24: 1.0000 | MEDICATED_PATCH | TRANSDERMAL | Status: DC

## 2012-04-04 NOTE — Patient Instructions (Addendum)

## 2012-04-07 NOTE — Progress Notes (Signed)
Patient ID: DYLAN RUOTOLO    DOB: 1956/03/14, 56 y.o.   MRN: [id number] --- Subjective:  Maura is a 56 y.o.female with h/o HTN, chronic bronchitis and tobacco abuse who presents for follow up.  -cough: continues to be present. Productive of mucus. Worst with laying down. Currently on advair which has helped compared to previously. She continues to use rescue albuterol more than twice daily. Continues to smoke on a daily basis. Denies any fever. Has some shortness of breath with walking for a short distance.  - tobacco abuse: tried quitting with chantix which did help with the smoking, but she did not like taking so many pills and stopped taking it. She knows she needs to quit and would like to try after the new year.   - HTN: takes her BP med almost every day. Sometimes skips one day out of the week. She denies any chest pain, any lower extremity swelling, any new onset shortness of breath.   ROS: see HPI Past Medical History: reviewed and updated medications and allergies. Social History: Tobacco: 10 cigarettes per day  Objective: Filed Vitals:   04/04/12 1425  BP: 142/85  Pulse: 93   Repeat BP: 130/70  Physical Examination:   General appearance - alert, well appearing, and in no distress Nose - congested and erythematous nasal turbinates. Mouth - mucous membranes moist, pharynx normal without lesions Neck - supple, no significant adenopathy Chest - clear to auscultation, no wheezes, rales or rhonchi, symmetric air entry Heart - normal rate, regular rhythm, normal S1, S2, no murmurs, rubs, clicks or gallops Extremities - no pedal edema

## 2012-04-07 NOTE — Assessment & Plan Note (Signed)
Continues to have persistent cough and albuterol use with advair. PFT's from May 2013 showing Moderate restrictive lung disease. I feel like best treatment at this point is smoking cessation. Reiterated the importance of smoking cessation for patient's health and lung function. Patient expressed understanding. Interested in nicotine patch for which I gave her Rx for to get through [facility]. Follow up in 1 month

## 2012-04-07 NOTE — Assessment & Plan Note (Signed)
Repeat BP at 10/70. Currently on HCTZ 25mg  daily. Reiterated the importance of taking BP meds on regular basis. Patient agreed. Follow up in 1 month

## 2012-05-07 ENCOUNTER — Ambulatory Visit (INDEPENDENT_AMBULATORY_CARE_PROVIDER_SITE_OTHER): Payer: No Typology Code available for payment source | Admitting: Family Medicine

## 2012-05-07 ENCOUNTER — Encounter: Payer: Self-pay | Admitting: Family Medicine

## 2012-05-07 VITALS — BP 139/82 | HR 74 | Ht 63.5 in | Wt 198.0 lb

## 2012-05-07 DIAGNOSIS — R42 Dizziness and giddiness: Secondary | ICD-10-CM

## 2012-05-07 DIAGNOSIS — F17200 Nicotine dependence, unspecified, uncomplicated: Secondary | ICD-10-CM

## 2012-05-07 DIAGNOSIS — K219 Gastro-esophageal reflux disease without esophagitis: Secondary | ICD-10-CM

## 2012-05-07 MED ORDER — RANITIDINE HCL 150 MG PO TABS: 150.0000 mg | ORAL_TABLET | Freq: Two times a day (BID) | ORAL | Status: DC

## 2012-05-07 NOTE — Patient Instructions (Signed)
For the weight: the goal is to cut out candy bars and drink diet soda (0 calories at the back of the bottle).  More vegetables.  Follow up in 3 months.     Heartburn Heartburn is a painful, burning sensation in the chest. It may feel worse in certain positions, such as lying down or bending over. It is caused by stomach acid backing up into the tube that carries food from the mouth down to the stomach (lower esophagus).  CAUSES   Large meals.  Certain foods and drinks.  Exercise.  Increased acid production.  Being overweight or obese.  Certain medicines. SYMPTOMS   Burning pain in the chest or lower throat.  Bitter taste in the mouth.  Coughing. DIAGNOSIS  If the usual treatments for heartburn do not improve your symptoms, then tests may be done to see if there is another condition present. Possible tests may include:  X-rays.  Endoscopy. This is when a tube with a light and a camera on the end is used to examine the esophagus and the stomach.  A test to measure the amount of acid in the esophagus (pH test).  A test to see if the esophagus is working properly (esophageal manometry).  Blood, breath, or stool tests to check for bacteria that cause ulcers. TREATMENT   Your caregiver may tell you to use certain over-the-counter medicines (antacids, acid reducers) for mild heartburn.  Your caregiver may prescribe medicines to decrease the acid in your stomach or protect your stomach lining.  Your caregiver may recommend certain diet changes.  For severe cases, your caregiver may recommend that the head of your bed be elevated on blocks. (Sleeping with more pillows is not an effective treatment as it only changes the position of your head and does not improve the main problem of stomach acid refluxing into the esophagus.) HOME CARE INSTRUCTIONS   Take all medicines as directed by your caregiver.  Raise the head of your bed by putting blocks under the legs if instructed  to by your caregiver.  Do not exercise right after eating.  Avoid eating 2 or 3 hours before bed. Do not lie down right after eating.  Eat small meals throughout the day instead of 3 large meals.  Stop smoking if you smoke.  Maintain a healthy weight.  Identify foods and beverages that make your symptoms worse and avoid them. Foods you may want to avoid include:  Peppers.  Chocolate.  High-fat foods, including fried foods.  Spicy foods.  Garlic and onions.  Citrus fruits, including oranges, grapefruit, lemons, and limes.  Food containing tomatoes or tomato products.  Mint.  Carbonated drinks, caffeinated drinks, and alcohol.  Vinegar. SEEK IMMEDIATE MEDICAL CARE IF:  You have severe chest pain that goes down your arm or into your jaw or neck.  You feel sweaty, dizzy, or lightheaded.  You are short of breath.  You vomit blood.  You have difficulty or pain with swallowing.  You have bloody or black, tarry stools.  You have episodes of heartburn more than 3 times a week for more than 2 weeks. MAKE SURE YOU:  Understand these instructions.  Will watch your condition.  Will get help right away if you are not doing well or get worse. Document Released: 08/27/2008 Document Revised: 07/03/2011 Document Reviewed: 09/25/2010 [facility] Patient Information 2013 [place], [place].

## 2012-05-07 NOTE — Progress Notes (Signed)
Patient ID: KARTER HAIRE    DOB: January 31, 1956, 57 y.o.   MRN: [id number] --- Subjective:  Parisa is a 57 y.o.female who presents with for follow up and following concerns.  - burning in stomach after eating certain foods. Only happens when she eats spicy or fried foods. No vomiting or nausea. No melena or blood in stool. No feeling of food being stuck. Pain lasts 30 minutes or so and is improved with burping.  - smoking: quit smoking 14 days ago. Has not used any nicotine replacement. Her cough has improved and she has more energy walking, feeling less short winded. She feels like she is breathing better and not using her albuterol as much as she used to, although she continues to use it daily. She uses advair twice daily as well.  Her daughter occasionally smokes but she states that this doesn't bother as much.  - light headedness: not worst with standing. Feeling of being drunk. No headache, no ringing in the ear. X 2weeks.not worst with moving her head.  She thinks it coincides with her smoking cessation. She drinks water when her urine becomes dark.   ROS: see HPI Past Medical History: reviewed and updated medications and allergies. Social History: Tobacco: see above  Objective: Filed Vitals:   05/07/12 1438  BP: 139/82  Pulse: 74    Physical Examination:   General appearance - alert, well appearing, and in no distress Ears - bilateral TM's and external ear canals normal Nose - normal and patent, no erythema, discharge or polyps Chest - clear to auscultation, no wheezes, rales or rhonchi, symmetric air entry Heart - normal rate, regular rhythm, normal S1, S2, 2/6 systolic ejection murmur  Abdomen - soft, nontender, nondistended

## 2012-05-08 DIAGNOSIS — K219 Gastro-esophageal reflux disease without esophagitis: Secondary | ICD-10-CM | POA: Insufficient documentation

## 2012-05-08 DIAGNOSIS — R42 Dizziness and giddiness: Secondary | ICD-10-CM | POA: Insufficient documentation

## 2012-05-08 NOTE — Assessment & Plan Note (Signed)
Improved with smoking cessation. She continues to use albuterol quite significantly. At next visit, may ask patient to demonstrate advair use to make sure that she is dispensing it correctly. Continue advair and albuterol prn for now.

## 2012-05-08 NOTE — Assessment & Plan Note (Signed)
Reviewed foods to avoid. Rx for zantac 150mg  bid.

## 2012-05-08 NOTE — Assessment & Plan Note (Signed)
Likely from dehydration. No evidence of active bleeding, so will hold on CBC for now. If persists, will obtain it. Doesn't appear to be from vertigo given lack of association with head movement.  Patient to monitor and return if worst or not improved.

## 2012-05-08 NOTE — Assessment & Plan Note (Signed)
Congratulated patient on being smoke free for 14 days. Told her that she could come to see me or the pharmacy clinic at any point that she thinks she may need some extra help.

## 2012-09-25 ENCOUNTER — Encounter: Payer: Self-pay | Admitting: Family Medicine

## 2012-09-25 ENCOUNTER — Ambulatory Visit (INDEPENDENT_AMBULATORY_CARE_PROVIDER_SITE_OTHER): Payer: No Typology Code available for payment source | Admitting: Family Medicine

## 2012-09-25 VITALS — BP 135/78 | HR 96 | Ht 63.5 in | Wt 190.0 lb

## 2012-09-25 DIAGNOSIS — R195 Other fecal abnormalities: Secondary | ICD-10-CM

## 2012-09-25 DIAGNOSIS — E785 Hyperlipidemia, unspecified: Secondary | ICD-10-CM

## 2012-09-25 MED ORDER — PRAVASTATIN SODIUM 40 MG PO TABS: 40.0000 mg | ORAL_TABLET | Freq: Every day | ORAL | Status: DC

## 2012-09-25 MED ORDER — HYDROCHLOROTHIAZIDE 25 MG PO TABS: 25.0000 mg | ORAL_TABLET | Freq: Every day | ORAL | Status: DC

## 2012-09-25 NOTE — Patient Instructions (Addendum)
For the rectal bleeding, you do appear to have an internal hemorrhoid on exam. I recommend that you follow up with the GI doctors at [facility] for them to check about this.  We will also be checking some lab work. I would like to check a cholesterol panel and so, please make an appointment for a lab appointment any time this week in the morning. Come fasting, no food or drink other than water after midnight.

## 2012-09-27 NOTE — Assessment & Plan Note (Addendum)
Continues to report blood in stool. Fecal occult non conclusive today due to small amount of stool. Internal thrombosed hemorrhoid present on anoscope exam. Precepted with my attending, Dr. Lum Babe who recommended referral back to [facility] for additional recommendations in light of colonoscopy done in August 2013 which showed one polyp which was removed. Patient to return to lab for CBC when she gets her yearly BMP and lipid panel

## 2012-09-27 NOTE — Assessment & Plan Note (Signed)
Continue advair and albuterol. Encouraged smoking cessation.

## 2012-09-27 NOTE — Progress Notes (Signed)
Patient ID: Sabrina [name]    DOB: 03-Feb-1956, 57 y.o.   MRN: [id number] --- Subjective:  Sabrina [name] is a 57 y.o.female who presents for follow up. Her main concern today is that she continues to have rectal bleeding.  Patient was evaluated in August by [facility] for rectal bleedingand had diagnostic colonoscopy which showed polyp which was removed. Otherwise, normal colonoscopy with return in 5 years.  Patient reports that since then she has had daily bleeding with wiping as well as on stool. No pain associated with this. Sometimes strains. Has a bowel movement twice a day. It is sometimes soft, sometimes hard. She denies any light headedness or fatigue. Denies any abdominal pain.   - chronic bronchitis: feels like she is breathing better. She is abe to walk 4-5 blocks without getting short winded. She takes her advair daily and uses albuterol once a day. She started smoking again and is smoking 5 cgarettes a day, from 1 pack previously. She is also smoking ecigarettes. She tried the patch which helped but made her skin break out. She did not want to take chantix because she is already on so many pills. Denies significant coughing.    ROS: see HPI Past Medical History: reviewed and updated medications and allergies. Social History: Tobacco: 5 cigs per day, see above  Objective: Filed Vitals:   09/25/12 1549  BP: 135/78  Pulse: 96    Physical Examination:   General appearance - alert, well appearing, and in no distress Chest - clear to auscultation, no wheezes, rales or rhonchi, symmetric air entry Heart - normal rate, regular rhythm, normal S1, S2, 2/6 systolic murmur Abdomen - soft, non tender, non distended Extremities - no pedal edema Rectal exam - skin tag present, anoscope done showing thrombosed internal hemorrhoid, hemoccult non conclusive due to small amount of stool.

## 2012-10-02 ENCOUNTER — Other Ambulatory Visit: Payer: Self-pay

## 2012-10-02 DIAGNOSIS — E785 Hyperlipidemia, unspecified: Secondary | ICD-10-CM

## 2012-10-02 LAB — COMPREHENSIVE METABOLIC PANEL
ALT: 29 U/L (ref 0–35)
AST: 22 U/L (ref 0–37)
Albumin: 3.7 g/dL (ref 3.5–5.2)
Alkaline Phosphatase: 102 U/L (ref 39–117)
BUN: 8 mg/dL (ref 6–23)
CO2: 26 mEq/L (ref 19–32)
Calcium: 9.3 mg/dL (ref 8.4–10.5)
Chloride: 104 mEq/L (ref 96–112)
Creat: 0.71 mg/dL (ref 0.50–1.10)
Glucose, Bld: 133 mg/dL — ABNORMAL HIGH (ref 70–99)
Potassium: 3.4 mEq/L — ABNORMAL LOW (ref 3.5–5.3)
Sodium: 140 mEq/L (ref 135–145)
Total Bilirubin: 0.3 mg/dL (ref 0.3–1.2)
Total Protein: 7 g/dL (ref 6.0–8.3)

## 2012-10-02 LAB — LIPID PANEL
Cholesterol: 135 mg/dL (ref 0–200)
HDL: 27 mg/dL — ABNORMAL LOW (ref >39)
LDL Cholesterol: 72 mg/dL (ref 0–99)
Total CHOL/HDL Ratio: 5 Ratio
Triglycerides: 178 mg/dL — ABNORMAL HIGH (ref <150)
VLDL: 36 mg/dL (ref 0–40)

## 2012-10-02 LAB — CBC
HCT: 40.4 % (ref 36.0–46.0)
Hemoglobin: 13.6 g/dL (ref 12.0–15.0)
MCH: 30.3 pg (ref 26.0–34.0)
MCHC: 33.7 g/dL (ref 30.0–36.0)
MCV: 90 fL (ref 78.0–100.0)
Platelets: 245 10*3/uL (ref 150–400)
RBC: 4.49 MIL/uL (ref 3.87–5.11)
RDW: 13.7 % (ref 11.5–15.5)
WBC: 8.7 10*3/uL (ref 4.0–10.5)

## 2012-10-02 NOTE — Progress Notes (Signed)
CBC,CMP AND FLP DONE TODAY Sherri Mcarthy 

## 2012-10-14 ENCOUNTER — Other Ambulatory Visit: Payer: Self-pay | Admitting: *Deleted

## 2012-10-15 MED ORDER — MOMETASONE FUROATE 50 MCG/ACT NA SUSP: 2.0000 | Freq: Every day | NASAL | Status: DC

## 2012-10-16 ENCOUNTER — Telehealth: Payer: Self-pay | Admitting: Family Medicine

## 2012-10-16 NOTE — Telephone Encounter (Signed)
Called patient back about lab results. Fasting BMP was significant for glucose of 136 and K of 3.4. I told her that I would like to repeat a fasting lab to see what the result of her glucose is. She agreed to this. Will get BMP and a1c.  Also, patient asked for refill on nasonex which I will print for tomorrow to pick up.   Sabrina [name], PGY-2 Family Medicine Resident

## 2012-10-17 ENCOUNTER — Telehealth: Payer: Self-pay | Admitting: Family Medicine

## 2012-10-17 MED ORDER — MOMETASONE FUROATE 50 MCG/ACT NA SUSP: 2.0000 | Freq: Every day | NASAL | Status: DC

## 2012-10-17 NOTE — Telephone Encounter (Signed)
Printing Rx for nasonex which I will place at front desk for patient to pick up.   Sabrina [name], PGY-2 Family Medicine Resident

## 2012-10-21 ENCOUNTER — Other Ambulatory Visit: Payer: No Typology Code available for payment source

## 2012-10-21 ENCOUNTER — Other Ambulatory Visit: Payer: Self-pay | Admitting: *Deleted

## 2012-10-21 DIAGNOSIS — E669 Obesity, unspecified: Secondary | ICD-10-CM

## 2012-10-21 DIAGNOSIS — I10 Essential (primary) hypertension: Secondary | ICD-10-CM

## 2012-10-21 LAB — CBC
HCT: 40.5 % (ref 36.0–46.0)
Hemoglobin: 13.9 g/dL (ref 12.0–15.0)
MCH: 30.1 pg (ref 26.0–34.0)
MCHC: 34.3 g/dL (ref 30.0–36.0)
MCV: 87.7 fL (ref 78.0–100.0)
Platelets: 286 10*3/uL (ref 150–400)
RBC: 4.62 MIL/uL (ref 3.87–5.11)
RDW: 13.7 % (ref 11.5–15.5)
WBC: 10.2 10*3/uL (ref 4.0–10.5)

## 2012-10-21 LAB — COMPREHENSIVE METABOLIC PANEL
ALT: 23 U/L (ref 0–35)
AST: 20 U/L (ref 0–37)
Albumin: 3.8 g/dL (ref 3.5–5.2)
Alkaline Phosphatase: 99 U/L (ref 39–117)
BUN: 14 mg/dL (ref 6–23)
CO2: 23 mEq/L (ref 19–32)
Calcium: 9.8 mg/dL (ref 8.4–10.5)
Chloride: 104 mEq/L (ref 96–112)
Creat: 0.77 mg/dL (ref 0.50–1.10)
Glucose, Bld: 116 mg/dL — ABNORMAL HIGH (ref 70–99)
Potassium: 3.8 mEq/L (ref 3.5–5.3)
Sodium: 136 mEq/L (ref 135–145)
Total Bilirubin: 0.4 mg/dL (ref 0.3–1.2)
Total Protein: 7.2 g/dL (ref 6.0–8.3)

## 2012-10-21 LAB — LIPID PANEL
Cholesterol: 147 mg/dL (ref 0–200)
HDL: 21 mg/dL — ABNORMAL LOW (ref >39)
LDL Cholesterol: 86 mg/dL (ref 0–99)
Total CHOL/HDL Ratio: 7 Ratio
Triglycerides: 201 mg/dL — ABNORMAL HIGH (ref <150)
VLDL: 40 mg/dL (ref 0–40)

## 2012-10-21 NOTE — Progress Notes (Signed)
CMP,CBC,FLP DONE TODAY Lamonda Noxon 

## 2012-10-28 ENCOUNTER — Encounter: Payer: Self-pay | Admitting: Family Medicine

## 2013-01-08 ENCOUNTER — Other Ambulatory Visit: Payer: Self-pay | Admitting: Family Medicine

## 2013-01-08 DIAGNOSIS — Z1231 Encounter for screening mammogram for malignant neoplasm of breast: Secondary | ICD-10-CM

## 2013-01-27 ENCOUNTER — Ambulatory Visit (HOSPITAL_COMMUNITY)
Admission: RE | Admit: 2013-01-27 | Discharge: 2013-01-27 | Disposition: A | Discharge status: Home / Self Care | Payer: No Typology Code available for payment source | Source: Ambulatory Visit | Attending: Radiology | Admitting: Radiology

## 2013-01-27 DIAGNOSIS — Z1231 Encounter for screening mammogram for malignant neoplasm of breast: Secondary | ICD-10-CM | POA: Insufficient documentation

## 2013-01-29 ENCOUNTER — Other Ambulatory Visit: Payer: Self-pay | Admitting: Family Medicine

## 2013-01-29 DIAGNOSIS — R928 Other abnormal and inconclusive findings on diagnostic imaging of breast: Secondary | ICD-10-CM

## 2013-02-12 ENCOUNTER — Ambulatory Visit
Admission: RE | Admit: 2013-02-12 | Discharge: 2013-02-12 | Disposition: A | Discharge status: Home / Self Care | Payer: No Typology Code available for payment source | Source: Ambulatory Visit | Attending: Family Medicine | Admitting: Family Medicine

## 2013-02-12 DIAGNOSIS — R928 Other abnormal and inconclusive findings on diagnostic imaging of breast: Secondary | ICD-10-CM

## 2013-06-25 ENCOUNTER — Ambulatory Visit (INDEPENDENT_AMBULATORY_CARE_PROVIDER_SITE_OTHER): Payer: No Typology Code available for payment source | Admitting: Family Medicine

## 2013-06-25 ENCOUNTER — Encounter: Payer: Self-pay | Admitting: Family Medicine

## 2013-06-25 VITALS — BP 124/74 | HR 88 | Ht 63.5 in | Wt 173.0 lb

## 2013-06-25 DIAGNOSIS — I10 Essential (primary) hypertension: Secondary | ICD-10-CM

## 2013-06-25 DIAGNOSIS — M722 Plantar fascial fibromatosis: Secondary | ICD-10-CM

## 2013-06-25 DIAGNOSIS — L600 Ingrowing nail: Secondary | ICD-10-CM

## 2013-06-25 DIAGNOSIS — E669 Obesity, unspecified: Secondary | ICD-10-CM

## 2013-06-25 LAB — POCT GLYCOSYLATED HEMOGLOBIN (HGB A1C): Hemoglobin A1C: 6.2

## 2013-06-25 MED ORDER — TERBINAFINE HCL 250 MG PO TABS: 250.0000 mg | ORAL_TABLET | Freq: Every day | ORAL | Status: DC

## 2013-06-25 NOTE — Patient Instructions (Signed)
I think the burning in your feet is from plantar fasciitis. Try the inserts.   Make an appointment at the procedure clinic for ingrown toenail on the right foot.   Take the antifungal medicine for the toenails for 6 weeks and follow up with me afterward.   Plantar Fasciitis Plantar fasciitis is a common condition that causes foot pain. It is soreness (inflammation) of the band of tough fibrous tissue on the bottom of the foot that runs from the heel bone (calcaneus) to the ball of the foot. The cause of this soreness may be from excessive standing, poor fitting shoes, running on hard surfaces, being overweight, having an abnormal walk, or overuse (this is common in runners) of the painful foot or feet. It is also common in aerobic exercise dancers and ballet dancers. SYMPTOMS  Most people with plantar fasciitis complain of:  Severe pain in the morning on the bottom of their foot especially when taking the first steps out of bed. This pain recedes after a few minutes of walking.  Severe pain is experienced also during walking following a long period of inactivity.  Pain is worse when walking barefoot or up stairs DIAGNOSIS   Your caregiver will diagnose this condition by examining and feeling your foot.  Special tests such as X-rays of your foot, are usually not needed. PREVENTION   Consult a sports medicine professional before beginning a new exercise program.  Walking programs offer a good workout. With walking there is a lower chance of overuse injuries common to runners. There is less impact and less jarring of the joints.  Begin all new exercise programs slowly. If problems or pain develop, decrease the amount of time or distance until you are at a comfortable level.  Wear good shoes and replace them regularly.  Stretch your foot and the heel cords at the back of the ankle (Achilles tendon) both before and after exercise.  Run or exercise on even surfaces that are not hard. For  example, asphalt is better than pavement.  Do not run barefoot on hard surfaces.  If using a treadmill, vary the incline.  Do not continue to workout if you have foot or joint problems. Seek professional help if they do not improve. HOME CARE INSTRUCTIONS   Avoid activities that cause you pain until you recover.  Use ice or cold packs on the problem or painful areas after working out.  Only take over-the-counter or prescription medicines for pain, discomfort, or fever as directed by your caregiver.  Soft shoe inserts or athletic shoes with air or gel sole cushions may be helpful.  If problems continue or become more severe, consult a sports medicine caregiver or your own health care provider. Cortisone is a potent anti-inflammatory medication that may be injected into the painful area. You can discuss this treatment with your caregiver. MAKE SURE YOU:   Understand these instructions.  Will watch your condition.  Will get help right away if you are not doing well or get worse. Document Released: 01/03/2001 Document Revised: 07/03/2011 Document Reviewed: 03/04/2008 [facility] Patient Information 2014 [place], [place].

## 2013-06-27 DIAGNOSIS — L600 Ingrowing nail: Secondary | ICD-10-CM | POA: Insufficient documentation

## 2013-06-27 DIAGNOSIS — M722 Plantar fascial fibromatosis: Secondary | ICD-10-CM | POA: Insufficient documentation

## 2013-06-27 NOTE — Assessment & Plan Note (Addendum)
Symptoms consistent with plantar fasciitis.  premiumorthotics inserts information given.  Morning stretching exercises.

## 2013-06-27 NOTE — Assessment & Plan Note (Signed)
Likely will need partial toenail removal for symptom management as toenail appears to be laterally ingrowing.  Will also treat nails for onychomycosis with lamisil for 6 weeks.

## 2013-06-27 NOTE — Assessment & Plan Note (Signed)
Well controled on hctz. A1C and pre-diabetes.

## 2013-06-27 NOTE — Progress Notes (Signed)
Patient ID: Sabrina [name]    DOB: Aug 30, 1955, 58 y.o.   MRN: [id number] --- Subjective:  Sabrina [name] is a 58 y.o.female who presents for follow up on htn. Her other concerns include:  - burning on bottom of feet. Pain is worst with walking and being on her feet throughout the day. She started a new job that requires standing throughout the day. Pain is better with rest. Started a few weeks ago.  - she also has had burning and pain on the lateral corner of her right great toe. Pain is worst with pressure from shoes. It has been ongoing for a couple of weeks. No drainage. Nails have been thick. - HTN: taking hctz every day. No lower extremity swelling. No chest pain. Denies significant shortness of breath. Doesn't check BP's at home.  She has been walking more and has been watching what she eats.   ROS: see HPI Past Medical History: reviewed and updated medications and allergies. Social History: Tobacco: 1pack per day  Objective: Filed Vitals:   06/25/13 1558  BP: 124/74  Pulse: 88    Physical Examination:   General appearance - alert, well appearing, and in no distress Chest - clear to auscultation, no wheezes, rales or rhonchi, symmetric air entry Heart - normal rate, regular rhythm, normal S1, S2, 2/6 systolic murmur best heard at left sternal border Extremities - no pedal edema Feet - no tenderness along plantar aspect of foot, normal sensation to light touch bilaterally, right great toe with nail ingrowing on the lateral side.  Thickened and dystrophic nails bilaterally

## 2013-07-10 ENCOUNTER — Ambulatory Visit (INDEPENDENT_AMBULATORY_CARE_PROVIDER_SITE_OTHER): Payer: No Typology Code available for payment source | Admitting: Family Medicine

## 2013-07-10 VITALS — BP 131/73 | HR 82 | Temp 98.2°F | Wt 170.3 lb

## 2013-07-10 DIAGNOSIS — L600 Ingrowing nail: Secondary | ICD-10-CM

## 2013-07-11 NOTE — Progress Notes (Signed)
Patient ID: ALPHIA BEHANNA, female   DOB: [birth year], 58 y.o.   MRN: [id number] Patient is here for further evaluation and management of the chronic ingrown toenail. She also notes that the toenail is quite thickened and painful when she wear shoes. She has had problems with both sides growing inward. No current infection.  SUBJECTIVE: All of her nails are quite thickened. The great toe nail on both feet is curved thickened with some apparent onychomycosis. The right great nail one she wants [place] to look at today. She has extreme curvature of the nail is growing into the toe on medial and lateral side. There is no sign of infection.  PROCEDURE NOTE: Ingrown nail, no infection.  Patient given informed consent, signed copy in the chart. Appropriate time out taken.  Large rubberband tightened with forceps used as tourniquet placed at base of the toe.  Area prepped and draped in usual sterile fashion.  Digital block done using 2% lidocaine without epinephrine, for cc total.  Nail elevator was used to for elevation of the right great toenail. Once elevated, the nail was grasped with hemostats and removed using hemostats and traction force.  Nail bed was ablated using curettage and phenol followed by copious alcohol wash.  No complications.  Minimal bleeding.  Sterile bandage applied and post procedure instructions given.  Patient tolerated procedure well without complications.

## 2013-09-02 ENCOUNTER — Other Ambulatory Visit: Payer: Self-pay | Admitting: Family Medicine

## 2013-09-02 ENCOUNTER — Other Ambulatory Visit: Payer: Self-pay

## 2013-09-02 DIAGNOSIS — R921 Mammographic calcification found on diagnostic imaging of breast: Secondary | ICD-10-CM

## 2013-09-16 ENCOUNTER — Ambulatory Visit
Admission: RE | Admit: 2013-09-16 | Discharge: 2013-09-16 | Disposition: A | Discharge status: Home / Self Care | Payer: No Typology Code available for payment source | Source: Ambulatory Visit | Attending: Family Medicine | Admitting: Family Medicine

## 2013-09-16 DIAGNOSIS — R921 Mammographic calcification found on diagnostic imaging of breast: Secondary | ICD-10-CM

## 2013-09-19 ENCOUNTER — Ambulatory Visit (INDEPENDENT_AMBULATORY_CARE_PROVIDER_SITE_OTHER): Payer: No Typology Code available for payment source | Admitting: Family Medicine

## 2013-09-19 ENCOUNTER — Encounter: Payer: Self-pay | Admitting: Family Medicine

## 2013-09-19 VITALS — BP 148/76 | HR 94 | Temp 98.4°F | Ht 64.0 in | Wt 166.7 lb

## 2013-09-19 DIAGNOSIS — B351 Tinea unguium: Secondary | ICD-10-CM | POA: Insufficient documentation

## 2013-09-19 DIAGNOSIS — L600 Ingrowing nail: Secondary | ICD-10-CM

## 2013-09-19 DIAGNOSIS — R634 Abnormal weight loss: Secondary | ICD-10-CM

## 2013-09-19 DIAGNOSIS — E669 Obesity, unspecified: Secondary | ICD-10-CM

## 2013-09-19 DIAGNOSIS — M799 Soft tissue disorder, unspecified: Secondary | ICD-10-CM

## 2013-09-19 DIAGNOSIS — F17200 Nicotine dependence, unspecified, uncomplicated: Secondary | ICD-10-CM

## 2013-09-19 DIAGNOSIS — M7989 Other specified soft tissue disorders: Secondary | ICD-10-CM | POA: Insufficient documentation

## 2013-09-19 DIAGNOSIS — I10 Essential (primary) hypertension: Secondary | ICD-10-CM

## 2013-09-19 LAB — COMPREHENSIVE METABOLIC PANEL
ALT: 19 U/L (ref 0–35)
AST: 16 U/L (ref 0–37)
Albumin: 4.2 g/dL (ref 3.5–5.2)
Alkaline Phosphatase: 106 U/L (ref 39–117)
BUN: 16 mg/dL (ref 6–23)
CO2: 27 mEq/L (ref 19–32)
Calcium: 10.3 mg/dL (ref 8.4–10.5)
Chloride: 103 mEq/L (ref 96–112)
Creat: 0.7 mg/dL (ref 0.50–1.10)
Glucose, Bld: 93 mg/dL (ref 70–99)
Potassium: 4.4 mEq/L (ref 3.5–5.3)
Sodium: 137 mEq/L (ref 135–145)
Total Bilirubin: 0.4 mg/dL (ref 0.2–1.2)
Total Protein: 7.5 g/dL (ref 6.0–8.3)

## 2013-09-19 LAB — CBC
HCT: 42.3 % (ref 36.0–46.0)
Hemoglobin: 14.7 g/dL (ref 12.0–15.0)
MCH: 31.1 pg (ref 26.0–34.0)
MCHC: 34.8 g/dL (ref 30.0–36.0)
MCV: 89.4 fL (ref 78.0–100.0)
Platelets: 291 10*3/uL (ref 150–400)
RBC: 4.73 MIL/uL (ref 3.87–5.11)
RDW: 13.3 % (ref 11.5–15.5)
WBC: 9.6 10*3/uL (ref 4.0–10.5)

## 2013-09-19 NOTE — Patient Instructions (Signed)
Please follow up in July to meet the new doctor. You will be tested for cholesterol at that time.   The clinic will call you to let you know if it is ok to restart the nail fungus medicine.   We are going to check an ultrasound of the area on your right abdomen.

## 2013-09-19 NOTE — Assessment & Plan Note (Signed)
Likely fatty tissue mass.  Since difficult to define on exam, will get soft tissue ultrasound to better define.

## 2013-09-19 NOTE — Assessment & Plan Note (Signed)
Some weight loss. Unintentional but could be explained by the fact that patient has been more active and been eating less now that she is working.  - check Cr and CBC. If unintentional weight loss persists or worsens, consider TSH

## 2013-09-19 NOTE — Assessment & Plan Note (Signed)
Unclear whether toenail that has grown back is dystrophic from trauma to the nail or has evidence of onychomycosis.  Treating for onychomycosis based on aspect of other toenails any way.  Patient can wash feet No evidence of ingrown toenail at this time

## 2013-09-19 NOTE — Assessment & Plan Note (Signed)
Check LFTs and if ok, restart terbinafine for another 6 weeks. Patient has already had 6 weeks of treatment with some benefit.

## 2013-09-19 NOTE — Assessment & Plan Note (Signed)
Encouraged smoking cessation. Patient not quite ready at this time.

## 2013-09-19 NOTE — Progress Notes (Signed)
Patient ID: Sabrina [name]    DOB: [birth year], 58 y.o.   MRN: [id number] --- Subjective:  Sabrina [name] is a 58 y.o.female who presents for follow up on right great toenail removal done on 07/10/13.  - toenail removal: She states that the toenail seems to have regrown. No pain. No trauma to area. She wonders if she can wash it and get it wet.  She had been taking terbinafine for 6 weeks and felt like the nails on the other toes were improving.  - she also reports feeling a mass in her right upper quadrant for 2 weeks. It hasn't grown in size. It hurts when she presses on it. Not taking any medicine for it. No nausea, no vomiting. No abdominal pain otherwise.  - weight loss: patient noticed she has lost a few pounds. She started a job a few months ago and feels like she doesn't eat as much as she did when she didn't work. Also, she has started walking.   ROS: see HPI Past Medical History: reviewed and updated medications and allergies. Social History: Tobacco: currently smoking a pack per day. She knows she needs to quit especially since her daughter is pregnant and is trying to quit too. She just finds it very difficult. She has tried in the past to quit for 3 months. She has met with Dr. [name] in the past and has a Rx for chantix that she hasn't taken.   Objective: Filed Vitals:   09/19/13 1434  BP: 148/76  Pulse: 94  Temp: 98.4 F (36.9 C)    Physical Examination:   General appearance - alert, well appearing, and in no distress Chest - clear to auscultation, no wheezes, rales or rhonchi, symmetric air entry Heart - normal rate, regular rhythm, normal S1, S2, 2/6 systolic murmur best heard at the left sternal border Abdomen - soft, soft tissue irregularly defined soft tissue mass in right upper quadrant ~3-4cm in size, tender with palpation, no hepatomegaly appreciated Extremities - dystrophic appearing nail on right great toe. No tenderness or redness.  Increased thickness of adjacent toenails  and toenails of left foot

## 2013-09-29 ENCOUNTER — Ambulatory Visit (HOSPITAL_COMMUNITY)
Admission: RE | Admit: 2013-09-29 | Discharge: 2013-09-29 | Disposition: A | Discharge status: Home / Self Care | Payer: No Typology Code available for payment source | Source: Ambulatory Visit | Attending: Family Medicine | Admitting: Family Medicine

## 2013-09-29 ENCOUNTER — Other Ambulatory Visit: Payer: Self-pay | Admitting: Family Medicine

## 2013-09-29 ENCOUNTER — Telehealth: Payer: Self-pay | Admitting: Family Medicine

## 2013-09-29 DIAGNOSIS — R1901 Right upper quadrant abdominal swelling, mass and lump: Secondary | ICD-10-CM | POA: Insufficient documentation

## 2013-09-29 DIAGNOSIS — M7989 Other specified soft tissue disorders: Secondary | ICD-10-CM

## 2013-09-29 NOTE — Telephone Encounter (Signed)
Please let patient know that the ultrasound did not show any mass or worrisome finding on her abdomen.   Thank you!  Liam Graham, PGY-3 Family Medicine Resident

## 2013-09-30 ENCOUNTER — Ambulatory Visit: Payer: No Typology Code available for payment source

## 2013-09-30 NOTE — Telephone Encounter (Signed)
Called and informed patient that her ultrasound was normal.Robert L Busick

## 2013-10-06 ENCOUNTER — Encounter: Payer: Self-pay | Admitting: Family Medicine

## 2013-10-15 ENCOUNTER — Other Ambulatory Visit: Payer: Self-pay | Admitting: Family Medicine

## 2013-10-15 NOTE — Telephone Encounter (Signed)
Also, refill request for Pravastatin.

## 2013-10-17 ENCOUNTER — Telehealth: Payer: Self-pay | Admitting: Family Medicine

## 2013-10-17 MED ORDER — HYDROCHLOROTHIAZIDE 25 MG PO TABS: ORAL_TABLET | ORAL | Status: DC

## 2013-10-17 NOTE — Telephone Encounter (Signed)
Called and spoke with patient, she is requesting a refill on her HCTZ, I told her I will send in one months supply but she must make an appointment with her new PCP for any further refills.Busick, Kevin Fenton

## 2013-10-17 NOTE — Telephone Encounter (Signed)
Pt called and needs a refill on her BP medication called in. jw °

## 2013-11-19 ENCOUNTER — Telehealth: Payer: Self-pay | Admitting: Family Medicine

## 2013-11-19 DIAGNOSIS — I10 Essential (primary) hypertension: Secondary | ICD-10-CM

## 2013-11-19 NOTE — Telephone Encounter (Signed)
Pt called and needs a refill on her BP medication called in. jw °

## 2013-11-20 NOTE — Telephone Encounter (Signed)
Pt is calling again to check the status of her request for a refill on her BP medication. jw

## 2013-11-21 MED ORDER — HYDROCHLOROTHIAZIDE 25 MG PO TABS: ORAL_TABLET | ORAL | Status: DC

## 2013-11-21 NOTE — Telephone Encounter (Signed)
Enough med sent in for patient to make it to appointment on 8/24. Patient aware.

## 2013-12-15 ENCOUNTER — Encounter: Payer: Self-pay | Admitting: Family Medicine

## 2013-12-15 ENCOUNTER — Other Ambulatory Visit: Payer: Self-pay | Admitting: Family Medicine

## 2013-12-15 ENCOUNTER — Ambulatory Visit (INDEPENDENT_AMBULATORY_CARE_PROVIDER_SITE_OTHER): Payer: No Typology Code available for payment source | Admitting: Family Medicine

## 2013-12-15 VITALS — BP 147/77 | HR 70 | Ht 63.5 in | Wt 162.0 lb

## 2013-12-15 DIAGNOSIS — I10 Essential (primary) hypertension: Secondary | ICD-10-CM

## 2013-12-15 DIAGNOSIS — B351 Tinea unguium: Secondary | ICD-10-CM

## 2013-12-15 DIAGNOSIS — M546 Pain in thoracic spine: Secondary | ICD-10-CM

## 2013-12-15 DIAGNOSIS — F17200 Nicotine dependence, unspecified, uncomplicated: Secondary | ICD-10-CM

## 2013-12-15 MED ORDER — HYDROCHLOROTHIAZIDE 25 MG PO TABS: ORAL_TABLET | ORAL | Status: DC

## 2013-12-15 MED ORDER — FLUTICASONE-SALMETEROL 100-50 MCG/DOSE IN AEPB: 1.0000 | INHALATION_SPRAY | Freq: Two times a day (BID) | RESPIRATORY_TRACT | Status: DC

## 2013-12-15 MED ORDER — PRAVASTATIN SODIUM 40 MG PO TABS: 40.0000 mg | ORAL_TABLET | Freq: Every day | ORAL | Status: DC

## 2013-12-15 MED ORDER — ALBUTEROL SULFATE HFA 108 (90 BASE) MCG/ACT IN AERS: 2.0000 | INHALATION_SPRAY | RESPIRATORY_TRACT | Status: DC | PRN

## 2013-12-15 MED ORDER — CYCLOBENZAPRINE HCL 10 MG PO TABS: 10.0000 mg | ORAL_TABLET | Freq: Every day | ORAL | Status: DC

## 2013-12-15 MED ORDER — MOMETASONE FUROATE 50 MCG/ACT NA SUSP: 2.0000 | Freq: Every day | NASAL | Status: DC

## 2013-12-15 MED ORDER — TERBINAFINE HCL 250 MG PO TABS: 250.0000 mg | ORAL_TABLET | Freq: Every day | ORAL | Status: DC

## 2013-12-15 MED ORDER — IBUPROFEN 600 MG PO TABS: 600.0000 mg | ORAL_TABLET | Freq: Three times a day (TID) | ORAL | Status: DC | PRN

## 2013-12-15 MED ORDER — LISINOPRIL 5 MG PO TABS: 5.0000 mg | ORAL_TABLET | Freq: Every day | ORAL | Status: DC

## 2013-12-15 NOTE — Progress Notes (Signed)
[facility]  Archie Patten, MD  Subjective:  Chief complaint: f/u on toe nail removal/onychomycosis, HTN, and thoracic back pain  HTN: Patient states she's been taking HCTZ regularly. Takes her BP occasionally at the drug store and it is normally in the 140s/80s. No chest pain or SOB. No dizziness or light headedness. Walks "far" everyday but cannon quantify the distance or time.   Back pain: Began about 4 days ago. It is in the thoracic region (central) and sometimes radiates up the neck and to the left arm. No numbness or tingling. No h/o of injury or trauma. Pt notes she had this once before and Dr, Otis Dials prescribed something that was very helpful. No weakness noted.  Toe nail: Right great toenail removal on 07/10/13. She took 6 weeks of Lamisil and had a f/u appt on 5/29, at which time her LFTs were wnl, however she was not continued on the medication. Pt did feel she had some benefit from the medication and is hoping to restart it.  ROS- No fever, chills, or change in urination   Past Medical History Patient Active Problem List   Diagnosis Date Noted  . Onychomycosis 09/19/2013  . Soft tissue mass 09/19/2013  . Ingrown toenail without infection 06/27/2013  . Plantar fasciitis 06/27/2013  . Acid reflux 05/08/2012  . Positive fecal occult blood test 11/16/2011  . Tooth pain 10/27/2011  . Back pain 10/27/2011  . Hypertension 09/19/2011  . Systolic murmur 16/01/9603  . Obesity (BMI 30.0-34.9) 08/20/2011  . Chronic bronchitis 08/18/2011  . Tobacco use disorder 04/25/1971    Medications- reviewed and updated Current Outpatient Prescriptions  Medication Sig Dispense Refill  . albuterol (PROVENTIL HFA;VENTOLIN HFA) 108 (90 BASE) MCG/ACT inhaler Inhale 2 puffs into the lungs as needed. For wheezing/shortness of breath  1 Inhaler  2  . Fluticasone-Salmeterol (ADVAIR) 100-50 MCG/DOSE AEPB Inhale 1 puff into the lungs 2 (two) times daily.  1 each  3  .  hydrochlorothiazide (HYDRODIURIL) 25 MG tablet TAKE 1 TABLET BY MOUTH EVERY DAY  90 tablet  1  . loratadine (CLARITIN) 10 MG tablet Take 10 mg by mouth daily.      . ranitidine (ZANTAC) 150 MG tablet Take 1 tablet (150 mg total) by mouth 2 (two) times daily.  60 tablet  2  . cyclobenzaprine (FLEXERIL) 10 MG tablet Take 1 tablet (10 mg total) by mouth at bedtime.  30 tablet  0  . ibuprofen (ADVIL,MOTRIN) 600 MG tablet Take 1 tablet (600 mg total) by mouth every 8 (eight) hours as needed.  30 tablet  0  . lisinopril (PRINIVIL,ZESTRIL) 5 MG tablet Take 1 tablet (5 mg total) by mouth daily.  30 tablet  0  . mometasone (NASONEX) 50 MCG/ACT nasal spray Place 2 sprays into the nose daily.  17 g  12  . nicotine (NICODERM CQ - DOSED IN MG/24 HOURS) 21 mg/24hr patch Place 1 patch onto the skin daily.  28 patch  2  . pravastatin (PRAVACHOL) 40 MG tablet Take 1 tablet (40 mg total) by mouth daily.  90 tablet  3  . terbinafine (LAMISIL) 250 MG tablet Take 1 tablet (250 mg total) by mouth daily.  90 tablet  0  . varenicline (CHANTIX CONTINUING MONTH PAK) 1 MG tablet Take 1 tablet (1 mg total) by mouth 2 (two) times daily.  60 tablet  4  . varenicline (CHANTIX STARTING MONTH PAK) 0.5 MG X 11 & 1 MG X 42 tablet Take one 0.5  mg tablet by mouth once daily for 3 days, then increase to one 0.5 mg tablet twice daily for 4 days, then increase to one 1 mg tablet twice daily.  53 tablet  0   No current facility-administered medications for this visit.   Pt currently smokes 1ppd.  Objective: BP 147/77  Pulse 70  Ht 5' 3.5" (1.613 m)  Wt 162 lb (73.483 kg)  BMI 28.24 kg/m2 Gen: No acute distress. Alert, cooperative with exam HEENT: Atraumatic, EOMI, PERRLA, Oropharynx clear. MMM CV: RRR. 2/6 systolic murmur heard best at the left sternal border. No rubs, or gallops noted. 2+ radial and DP pulses bilaterally. Resp: CTAB. No wheezing, crackles, or rhonchi noted. Abd: +BS. Soft, non-distended, non-tender. No rebound  or guarding.  Ext: No edema. Dystrophic appearing nail on the right great toe with yellow hue. Other toenails of the left and right foot are also quite thick and brittle.  MSK: No tenderness over the spina processes or the paraspinal muscles.  5/5 strength of shoulder abduction, adduction, and internal rotation. No abnormality of the empty can test. Sensation intact over the left and right arms b/l.   Assessment/Plan:  Hypertension BP elevated to 147/77 on this visit, manual BP check by MD consistent. Patient with a slightly elevated hemoglobin A1c to 6.2. States she's taking HCTZ regularly - Continue HCTZ - Will start lisinopril 5mg  daily - Lab appt in 2 weeks for CMET to assess kidney function and potassium  - Will have BP check in 2 weeks as well. - Consider the addition of ASA 81mg  given risk factors  Onychomycosis Continues to be an issue. Pt only got a 6wk treatment duration, ending in 08/2013. Will re-start treatment with Lamisil x 12 weeks (LFTs wnl after being on it for 6 weeks in the past). No current signs of infection. - Lamisil x 12wks - Discussed that possibility that appearance may not improve completely  Back pain Most likely 2/2 muscle spasm. No red flags on history or physical exam. Strength and sensation intact - Ice and heat  - Ibuprofen  - Flexeril qhs  Tobacco use disorder Patient states Chantix is expensive. Currently not interested in stopping smoking - Will continue to ask at each appt.    Orders Placed This Encounter  Procedures  . Comprehensive metabolic panel    Standing Status: Future     Number of Occurrences:      Standing Expiration Date: 12/16/2014    Meds ordered this encounter  Medications  . albuterol (PROVENTIL HFA;VENTOLIN HFA) 108 (90 BASE) MCG/ACT inhaler    Sig: Inhale 2 puffs into the lungs as needed. For wheezing/shortness of breath    Dispense:  1 Inhaler    Refill:  2  . Fluticasone-Salmeterol (ADVAIR) 100-50 MCG/DOSE AEPB     Sig: Inhale 1 puff into the lungs 2 (two) times daily.    Dispense:  1 each    Refill:  3  . hydrochlorothiazide (HYDRODIURIL) 25 MG tablet    Sig: TAKE 1 TABLET BY MOUTH EVERY DAY    Dispense:  90 tablet    Refill:  1  . mometasone (NASONEX) 50 MCG/ACT nasal spray    Sig: Place 2 sprays into the nose daily.    Dispense:  17 g    Refill:  12  . pravastatin (PRAVACHOL) 40 MG tablet    Sig: Take 1 tablet (40 mg total) by mouth daily.    Dispense:  90 tablet    Refill:  3  .  terbinafine (LAMISIL) 250 MG tablet    Sig: Take 1 tablet (250 mg total) by mouth daily.    Dispense:  90 tablet    Refill:  0  . lisinopril (PRINIVIL,ZESTRIL) 5 MG tablet    Sig: Take 1 tablet (5 mg total) by mouth daily.    Dispense:  30 tablet    Refill:  0  . ibuprofen (ADVIL,MOTRIN) 600 MG tablet    Sig: Take 1 tablet (600 mg total) by mouth every 8 (eight) hours as needed.    Dispense:  30 tablet    Refill:  0  . cyclobenzaprine (FLEXERIL) 10 MG tablet    Sig: Take 1 tablet (10 mg total) by mouth at bedtime.    Dispense:  30 tablet    Refill:  0

## 2013-12-15 NOTE — Assessment & Plan Note (Signed)
Most likely 2/2 muscle spasm. No red flags on history or physical exam. Strength and sensation intact - Ice and heat  - Ibuprofen  - Flexeril qhs

## 2013-12-15 NOTE — Assessment & Plan Note (Addendum)
BP elevated to 147/77 on this visit, manual BP check by MD consistent. Patient with a slightly elevated hemoglobin A1c to 6.2. States she's taking HCTZ regularly - Continue HCTZ - Will start lisinopril 5mg  daily - Lab appt in 2 weeks for CMET to assess kidney function and potassium  - Will have BP check in 2 weeks as well. - Consider the addition of ASA 81mg  given risk factors

## 2013-12-15 NOTE — Assessment & Plan Note (Addendum)
Continues to be an issue. Pt only got a 6wk treatment duration, ending in 08/2013. Will re-start treatment with Lamisil x 12 weeks (LFTs wnl after being on it for 6 weeks in the past). No current signs of infection. - Lamisil x 12wks - Discussed that possibility that appearance may not improve completely

## 2013-12-15 NOTE — Patient Instructions (Signed)
Please follow up in 2 weeks for a lab appointment and a blood pressure check Depending on the results of your lab work, we may continue the new blood pressure medication, lisinopril.  Terbinafine tablets What is this medicine? TERBINAFINE (TER bin a feen) is an antifungal medicine. It is used to treat certain kinds of fungal or yeast infections. This medicine may be used for other purposes; ask your health care provider or pharmacist if you have questions. COMMON BRAND NAME(S): Lamisil, Terbinex What should I tell my health care provider before I take this medicine? They need to know if you have any of these conditions: -drink alcoholic beverages -kidney disease -liver disease -an unusual or allergic reaction to terbinafine, other medicines, foods, dyes, or preservatives -pregnant or trying to get pregnant -breast-feeding How should I use this medicine? Take this medicine by mouth with a full glass of water. Follow the directions on the prescription label. You can take this medicine with food or on an empty stomach. Take your medicine at regular intervals. Do not take your medicine more often than directed. Do not skip doses or stop your medicine early even if you feel better. Do not stop taking except on your doctor's advice. Talk to your pediatrician regarding the use of this medicine in children. Special care may be needed. Overdosage: If you think you have taken too much of this medicine contact a poison control center or emergency room at once. NOTE: This medicine is only for you. Do not share this medicine with others. What if I miss a dose? If you miss a dose, take it as soon as you can. If it is almost time for your next dose, take only that dose. Do not take double or extra doses. What may interact with this medicine? Do not take this medicine with any of the following medications: -thioridazine This medicine may also interact with the following  medications: -beta-blockers -caffeine -cimetidine -cyclosporine -medicines for depression, anxiety, or psychotic disturbances -medicines for fungal infections like fluconazole and ketoconazole -medicines for irregular heartbeat like amiodarone, flecainide and propafenone -rifampin -warfarin This list may not describe all possible interactions. Give your health care provider a list of all the medicines, herbs, non-prescription drugs, or dietary supplements you use. Also tell them if you smoke, drink alcohol, or use illegal drugs. Some items may interact with your medicine. What should I watch for while using this medicine? Visit your doctor or health care provider regularly. Tell your doctor right away if you have nausea or vomiting, loss of appetite, stomach pain on your right upper side, yellow skin, dark urine, light stools, or are over tired. Some fungal infections need many weeks or months of treatment to cure. If you are taking this medicine for a long time, you will need to have important blood work done. What side effects may I notice from receiving this medicine? Side effects that you should report to your doctor or health care professional as soon as possible: -allergic reactions like skin rash or hives, swelling of the face, lips, or tongue -changes in vision -dark urine -fever or infection -general ill feeling or flu-like symptoms -light-colored stools -loss of appetite, nausea -redness, blistering, peeling or loosening of the skin, including inside the mouth -right upper belly pain -unusually weak or tired -yellowing of the eyes or skin Side effects that usually do not require medical attention (report to your doctor or health care professional if they continue or are bothersome): -changes in taste -diarrhea -hair loss -muscle  or joint pain -stomach gas -stomach upset This list may not describe all possible side effects. Call your doctor for medical advice about side  effects. You may report side effects to FDA at 1-800-FDA-1088. Where should I keep my medicine? Keep out of the reach of children. Store at room temperature below 25 degrees C (77 degrees F). Protect from light. Throw away any unused medicine after the expiration date. NOTE: This sheet is a summary. It may not cover all possible information. If you have questions about this medicine, talk to your doctor, pharmacist, or health care provider.  2015, [employer] Standard. (2007-06-21 16:28:07)

## 2013-12-15 NOTE — Assessment & Plan Note (Signed)
Patient states Chantix is expensive. Currently not interested in stopping smoking - Will continue to ask at each appt.

## 2013-12-30 ENCOUNTER — Ambulatory Visit (INDEPENDENT_AMBULATORY_CARE_PROVIDER_SITE_OTHER): Payer: Self-pay | Admitting: *Deleted

## 2013-12-30 VITALS — BP 138/70 | HR 88

## 2013-12-30 DIAGNOSIS — Z0130 Encounter for examination of blood pressure without abnormal findings: Secondary | ICD-10-CM

## 2013-12-30 DIAGNOSIS — Z136 Encounter for screening for cardiovascular disorders: Secondary | ICD-10-CM

## 2013-12-30 NOTE — Progress Notes (Signed)
   Pt in nurse clinic for blood pressure check.  Blood pressure 138/70 manually, heart rate 88.  Pt denies any chest pain, SOB, dizziness, headache, visual changes or numbness/tingling of extremities.  Pt is taking medication as prescribed.  Will forward to PCP. Derl Barrow, RN

## 2014-01-05 ENCOUNTER — Other Ambulatory Visit: Payer: Self-pay

## 2014-01-05 DIAGNOSIS — Z1231 Encounter for screening mammogram for malignant neoplasm of breast: Secondary | ICD-10-CM

## 2014-01-29 ENCOUNTER — Ambulatory Visit: Payer: No Typology Code available for payment source

## 2014-02-13 ENCOUNTER — Ambulatory Visit
Admission: RE | Admit: 2014-02-13 | Discharge: 2014-02-13 | Disposition: A | Discharge status: Home / Self Care | Payer: No Typology Code available for payment source | Source: Ambulatory Visit

## 2014-02-13 DIAGNOSIS — Z1231 Encounter for screening mammogram for malignant neoplasm of breast: Secondary | ICD-10-CM

## 2014-07-14 ENCOUNTER — Ambulatory Visit (INDEPENDENT_AMBULATORY_CARE_PROVIDER_SITE_OTHER): Payer: BLUE CROSS/BLUE SHIELD | Admitting: Family Medicine

## 2014-07-14 ENCOUNTER — Encounter: Payer: Self-pay | Admitting: Family Medicine

## 2014-07-14 VITALS — BP 134/60 | HR 75 | Temp 98.2°F | Ht 64.0 in | Wt 160.0 lb

## 2014-07-14 DIAGNOSIS — I10 Essential (primary) hypertension: Secondary | ICD-10-CM

## 2014-07-14 DIAGNOSIS — B351 Tinea unguium: Secondary | ICD-10-CM

## 2014-07-14 LAB — COMPREHENSIVE METABOLIC PANEL
ALT: 16 U/L (ref 0–35)
AST: 18 U/L (ref 0–37)
Albumin: 4.1 g/dL (ref 3.5–5.2)
Alkaline Phosphatase: 111 U/L (ref 39–117)
BUN: 16 mg/dL (ref 6–23)
CO2: 28 mEq/L (ref 19–32)
Calcium: 9.6 mg/dL (ref 8.4–10.5)
Chloride: 106 mEq/L (ref 96–112)
Creat: 0.63 mg/dL (ref 0.50–1.10)
Glucose, Bld: 95 mg/dL (ref 70–99)
Potassium: 4 mEq/L (ref 3.5–5.3)
Sodium: 138 mEq/L (ref 135–145)
Total Bilirubin: 0.3 mg/dL (ref 0.2–1.2)
Total Protein: 7.1 g/dL (ref 6.0–8.3)

## 2014-07-14 MED ORDER — MOMETASONE FUROATE 50 MCG/ACT NA SUSP: 2.0000 | Freq: Every day | NASAL | Status: DC

## 2014-07-14 MED ORDER — ALBUTEROL SULFATE HFA 108 (90 BASE) MCG/ACT IN AERS: 2.0000 | INHALATION_SPRAY | RESPIRATORY_TRACT | Status: DC | PRN

## 2014-07-14 MED ORDER — LISINOPRIL 5 MG PO TABS: 5.0000 mg | ORAL_TABLET | Freq: Every day | ORAL | Status: DC

## 2014-07-14 MED ORDER — FLUTICASONE-SALMETEROL 100-50 MCG/DOSE IN AEPB: 1.0000 | INHALATION_SPRAY | Freq: Two times a day (BID) | RESPIRATORY_TRACT | Status: DC

## 2014-07-14 MED ORDER — HYDROCHLOROTHIAZIDE 25 MG PO TABS: ORAL_TABLET | ORAL | Status: DC

## 2014-07-14 NOTE — Assessment & Plan Note (Signed)
Stable today with the addition of lisinopril. No symptoms of hypotension or medication side effects. -cmet today  - continue HCTZ and lisinopril  - patient to f/u in 3 months

## 2014-07-14 NOTE — Assessment & Plan Note (Signed)
Appearance appears stable vs slightly worsened given then 2nd toe on the right is now discolored. Discussed that the medication may not improve the appearance completely. Additionally, I have concerns about the compliance as well. - Will hold off on any additional treatment - F/u LFTs today.

## 2014-07-14 NOTE — Patient Instructions (Signed)
It was nice to see you again. Since you were on the Lamisil (fungal medication) for 12 weeks I will check your liver function today I will also check you kidney function and your electrolytes since we added that other blood pressure medication at your last visit. Please schedule an appointment with me as soon as possible for a pap smear.  Chronic Asthmatic Bronchitis Chronic asthmatic bronchitis is a complication of persistent asthma. After a period of time with asthma, some people develop airflow obstruction that is present all the time, even when not having an asthma attack.There is also persistent inflammation of the airways, and the bronchial tubes produce more mucus. Chronic asthmatic bronchitis usually is a permanent problem with the lungs. CAUSES  Chronic asthmatic bronchitis happens most often in people who have asthma and also smoke cigarettes. Occasionally, it can happen to a person with long-standing or severe asthma even if the person is not a smoker. SIGNS AND SYMPTOMS  Chronic asthmatic bronchitis usually causes symptoms of both asthma and chronic bronchitis, including:   Coughing.  Increased sputum production.  Wheezing and shortness of breath.  Chest discomfort.  Recurring infections. DIAGNOSIS  Your health care provider will take a medical history and perform a physical exam. Chronic asthmatic bronchitis is suspected when a person with asthma has abnormal results on breathing tests (pulmonary function tests) even when breathing symptoms are at their best. Other tests, such as a chest X-ray, may be performed to rule out other conditions.  TREATMENT  Treatment involves controlling symptoms with medicine and lifestyle changes.  Your health care provider may prescribe asthma medicines, including inhaler and nebulizer medicines.  Infection can be treated with medicine to kill germs (antibiotics). Serious infections may require hospitalization. These can  include:  Pneumonia.  Sinus infections.  Acute bronchitis.   Preventing infection and hospitalization is very important. Get an influenza vaccination every year as directed by your health care provider. Ask your health care provider whether you need a pneumonia vaccine.  Ask your health care provider whether you would benefit from a pulmonary rehabilitation program. HOME CARE INSTRUCTIONS  Take medicines only as directed by your health care provider.  If you are a cigarette smoker, the most important thing that you can do is quit. Talk to your health care provider for help with quitting smoking.  Avoid pollen, dust, animal dander, molds, smoke, and other things that cause attacks.  Regular exercise is very important to help you feel better. Discuss possible exercise routines with your health care provider.  If animal dander is the cause of asthma, you may not be able to keep pets.  It is important that you:  Become educated about your medical condition.  Participate in maintaining wellness.  Seek medical care as directed. Delay in seeking medical care could cause permanent injury and may be a risk to your life. SEEK MEDICAL CARE IF:  You have wheezing and shortness of breath even if taking medicine to prevent attacks.  You have muscle aches, chest pain, or thickening of sputum.  Your sputum changes from clear or white to yellow, green, gray, or bloody. SEEK IMMEDIATE MEDICAL CARE IF:  Your usual medicines do not stop your wheezing.  You have increased coughing or shortness of breath or both.  You have increased difficulty breathing.  You have any problems from the medicine you are taking, such as a rash, itching, swelling, or trouble breathing. MAKE SURE YOU:   Understand these instructions.  Will watch your condition.  Will get help right away if you are not doing well or get worse. Document Released: 01/26/2006 Document Revised: 08/25/2013 Document Reviewed:  05/19/2013 [facility] Patient Information 2015 [place], [place]. This information is not intended to replace advice given to you by your health care provider. Make sure you discuss any questions you have with your health care provider.

## 2014-07-14 NOTE — Progress Notes (Signed)
Patient ID: Sabrina [name], female   DOB: 1955/05/13, 59 y.o.   MRN: [id number]    Subjective: CC: f/u on HTN and medication refill HPI: Patient is a 59 y.o. female with a past medical history of hypertension, chronic bronchitis, and onychomycosis presenting to clinic today for f/u HTN.  Hypertension Blood pressure at home: doesn't take Blood pressure today: 134/60  Meds: Compliant with HCTZ, lisinopril. Lisinopril was added at her last appt in 11/2013, unfortunately she never returned for a lab visit to f/u on electrolytes.  Side effects: None  ROS: Denies headache, dizziness, visual changes, nausea, vomiting, chest pain, abdominal pain or shortness of breath.  Onychomycosis:  Underwent right great toenail removal on 07/10/13 and took 6 weeks of Lamisil and had a f/u appt on 5/29, at which time her LFTs were wnl, however she did not continue on the medication. She returned on 12/15/13 requesting another treatment. Since she had an incomplete treatment prior, she was given a 12 week supply which she completed. The patient does not feel that her toenails improved and in fact feel that it may have worsened.   Social History: Smokes 1ppd  Health Maintenance: due for a pap smear, patient advised to make appt.  ROS: All other systems reviewed and are negative.  Past Medical History Patient Active Problem List   Diagnosis Date Noted  . Onychomycosis 09/19/2013  . Soft tissue mass 09/19/2013  . Ingrown toenail without infection 06/27/2013  . Plantar fasciitis 06/27/2013  . Acid reflux 05/08/2012  . Positive fecal occult blood test 11/16/2011  . Tooth pain 10/27/2011  . Back pain 10/27/2011  . Hypertension 09/19/2011  . Systolic murmur 13/24/4010  . Obesity (BMI 30.0-34.9) 08/20/2011  . Chronic bronchitis 08/18/2011  . Tobacco use disorder 04/25/1971    Medications- reviewed and updated Current Outpatient Prescriptions  Medication Sig Dispense Refill  . albuterol (PROVENTIL  HFA;VENTOLIN HFA) 108 (90 BASE) MCG/ACT inhaler Inhale 2 puffs into the lungs as needed. For wheezing/shortness of breath 1 Inhaler 2  . Fluticasone-Salmeterol (ADVAIR) 100-50 MCG/DOSE AEPB Inhale 1 puff into the lungs 2 (two) times daily. 1 each 3  . hydrochlorothiazide (HYDRODIURIL) 25 MG tablet TAKE 1 TABLET BY MOUTH EVERY DAY 90 tablet 1  . ibuprofen (ADVIL,MOTRIN) 600 MG tablet Take 1 tablet (600 mg total) by mouth every 8 (eight) hours as needed. 30 tablet 0  . lisinopril (PRINIVIL,ZESTRIL) 5 MG tablet Take 1 tablet (5 mg total) by mouth daily. 30 tablet 0  . loratadine (CLARITIN) 10 MG tablet Take 10 mg by mouth daily.    . mometasone (NASONEX) 50 MCG/ACT nasal spray Place 2 sprays into the nose daily. 17 g 12  . pravastatin (PRAVACHOL) 40 MG tablet Take 1 tablet (40 mg total) by mouth daily. 90 tablet 3  . ranitidine (ZANTAC) 150 MG tablet Take 1 tablet (150 mg total) by mouth 2 (two) times daily. 60 tablet 2  . terbinafine (LAMISIL) 250 MG tablet Take 1 tablet (250 mg total) by mouth daily. 90 tablet 0  . varenicline (CHANTIX STARTING MONTH PAK) 0.5 MG X 11 & 1 MG X 42 tablet Take one 0.5 mg tablet by mouth once daily for 3 days, then increase to one 0.5 mg tablet twice daily for 4 days, then increase to one 1 mg tablet twice daily. 53 tablet 0   No current facility-administered medications for this visit.    Objective: Office vital signs reviewed. BP 134/60 mmHg  Pulse 75  Temp(Src) 98.2 F (36.8  C) (Oral)  Ht 5\' 4"  (1.626 m)  Wt 160 lb (72.576 kg)  BMI 27.45 kg/m2   Physical Examination:  General: Awake, alert, well- nourished, NAD ENMT:   Oropharynx clear without erythema or tonsillar exudate/hypertrophy Eyes: Conjunctiva non-injected. PERRL.  Cardio: RRR, 2/6 systolic murmur noted best at the LUSB. No rubs or gallops noted.  Pulm: No increased WOB.  CTAB, without wheezes, rhonchi or crackles noted.  GI: soft, NT/ND,+BS x4, no hepatomegaly, no splenomegaly MSK: Normal gait  and station Skin: Thickened hypertrophic yellow great toenail and 2nd toe nail on the right. Other toenails on the right are thickened and brittle without discoloration.  Assessment/Plan: Hypertension Stable today with the addition of lisinopril. No symptoms of hypotension or medication side effects. -cmet today  - continue HCTZ and lisinopril  - patient to f/u in 3 months   Onychomycosis Appearance appears stable vs slightly worsened given then 2nd toe on the right is now discolored. Discussed that the medication may not improve the appearance completely. Additionally, I have concerns about the compliance as well. - Will hold off on any additional treatment - F/u LFTs today.     Orders Placed This Encounter  Procedures  . Comprehensive metabolic panel    Meds ordered this encounter  Medications  . albuterol (PROVENTIL HFA;VENTOLIN HFA) 108 (90 BASE) MCG/ACT inhaler    Sig: Inhale 2 puffs into the lungs as needed. For wheezing/shortness of breath    Dispense:  1 Inhaler    Refill:  2  . Fluticasone-Salmeterol (ADVAIR) 100-50 MCG/DOSE AEPB    Sig: Inhale 1 puff into the lungs 2 (two) times daily.    Dispense:  1 each    Refill:  3  . hydrochlorothiazide (HYDRODIURIL) 25 MG tablet    Sig: TAKE 1 TABLET BY MOUTH EVERY DAY    Dispense:  90 tablet    Refill:  1  . lisinopril (PRINIVIL,ZESTRIL) 5 MG tablet    Sig: Take 1 tablet (5 mg total) by mouth daily.    Dispense:  30 tablet    Refill:  0  . mometasone (NASONEX) 50 MCG/ACT nasal spray    Sig: Place 2 sprays into the nose daily.    Dispense:  17 g    Refill:  [facility] PGY-1, [place]

## 2014-07-15 ENCOUNTER — Encounter: Payer: Self-pay | Admitting: Family Medicine

## 2014-07-16 ENCOUNTER — Telehealth: Payer: Self-pay | Admitting: Family Medicine

## 2014-07-16 NOTE — Telephone Encounter (Signed)
error 

## 2014-08-25 ENCOUNTER — Encounter: Payer: Self-pay | Admitting: Family Medicine

## 2014-08-25 ENCOUNTER — Ambulatory Visit (INDEPENDENT_AMBULATORY_CARE_PROVIDER_SITE_OTHER): Payer: BLUE CROSS/BLUE SHIELD | Admitting: Family Medicine

## 2014-08-25 ENCOUNTER — Other Ambulatory Visit (HOSPITAL_COMMUNITY)
Admission: RE | Admit: 2014-08-25 | Discharge: 2014-08-25 | Disposition: A | Discharge status: Home / Self Care | Payer: BLUE CROSS/BLUE SHIELD | Source: Ambulatory Visit | Attending: Family Medicine | Admitting: Family Medicine

## 2014-08-25 VITALS — BP 120/68 | HR 79 | Temp 99.0°F | Ht 64.0 in | Wt 151.7 lb

## 2014-08-25 DIAGNOSIS — Z01419 Encounter for gynecological examination (general) (routine) without abnormal findings: Secondary | ICD-10-CM | POA: Insufficient documentation

## 2014-08-25 DIAGNOSIS — Z1151 Encounter for screening for human papillomavirus (HPV): Secondary | ICD-10-CM | POA: Diagnosis present

## 2014-08-25 DIAGNOSIS — I10 Essential (primary) hypertension: Secondary | ICD-10-CM | POA: Diagnosis not present

## 2014-08-25 DIAGNOSIS — Z0000 Encounter for general adult medical examination without abnormal findings: Secondary | ICD-10-CM | POA: Insufficient documentation

## 2014-08-25 MED ORDER — HYDROCHLOROTHIAZIDE 25 MG PO TABS: ORAL_TABLET | ORAL | Status: DC

## 2014-08-25 MED ORDER — LISINOPRIL 5 MG PO TABS: 5.0000 mg | ORAL_TABLET | Freq: Every day | ORAL | Status: DC

## 2014-08-25 NOTE — Progress Notes (Signed)
Patient ID: XARENI KELCH, female   DOB: Dec 04, 1955, 59 y.o.   MRN: [id number] 59 y.o. year old female presents for well woman/preventative visit and annual GYN examination.  Acute Concerns: None   Diet:  Varied, not as many fruits/vegetables as she'd like   Exercise: Patient not exercising regularly.   Sexual/Birth History: G3P3 No concerns for STDs or vaginal discharge   Birth Control: S/p hysterectomy with removal of 1 ovary due to infection in 1980s/1990s (unsure if she has a cervix).  POA/Living Will:  Information provided  Social:  History   Social History  . Marital Status: Divorced    Spouse Name: N/A  . Number of Children: N/A  . Years of Education: N/A   Social History Main Topics  . Smoking status: Current Every Day Smoker -- 1.00 packs/day    Types: Cigarettes  . Smokeless tobacco: Never Used     Comment: decreased smoking  . Alcohol Use: 0.0 oz/week    0 Standard drinks or equivalent per week  . Drug Use: No  . Sexual Activity: Not on file   Other Topics Concern  . None   Social History Narrative    Immunization:  Tdap/TD: Declines   Cancer Screening:  Pap Smear: patient has had a partial hysterectomy   Mammogram:  01/2014 : negative   Colonoscopy: 12/05/11: single 51mm polyp, repeat in 5 yrs (2018).  Dexa:  Physical Exam: Blood pressure 120/68, pulse 79, temperature 99 F (37.2 C), temperature source Oral, height 5\' 4"  (1.626 m), weight 151 lb 11.2 oz (68.811 kg). GEN: Alert, well nourished, in NAD. Patient's affect appears flat. ABD: soft, ND/NT GU/GYN:Exam performed in the presence of a chaperone.  External genitalia within normal limits.  Vaginal mucosa pink, moist, normal rugae. Cervix not visualized, no discharge or bleeding noted on speculum exam. No adnexal masses bilaterally.    ASSESSMENT & PLAN: 59 y.o. female presents for annual well woman/preventative exam and GYN exam. Please see problem specific assessment and plan.

## 2014-08-25 NOTE — Assessment & Plan Note (Signed)
Patient UTD on mammogram and colonoscopy. Declined HIV testing. No cervix noted on exam, however specimen obtained from what appeared to be the cervical cuff.

## 2014-08-25 NOTE — Patient Instructions (Signed)
It was nice to see you again. I have refilled your blood pressure medicines today. Please follow up with me in 3 months for your blood pressure.  Things to do to Keep yourself Healthy - Exercise at least 30-45 minutes a day,  3-4 days a week.  - Eat a low-fat diet with lots of fruits and vegetables, up to 7-9 servings per day. - Seatbelts can save your life. Wear them always. - Smoke detectors on every level of your home, check batteries every year. - Eye Doctor - have an eye exam every 1-2 years - Safe sex - if you may be exposed to STDs, use a condom. - Alcohol If you drink, do it moderately,less than 2 drinks per day. - [place].  Choose someone to speak for you if you are not able. - Depression is common in our stressful world.If you're feeling down or losing interest in things you normally enjoy, please come in for a visit. - Violence - If anyone is threatening or hurting you, please call immediately.

## 2014-08-26 LAB — CYTOLOGY - PAP

## 2014-12-21 ENCOUNTER — Encounter: Payer: Self-pay | Admitting: Family Medicine

## 2014-12-21 ENCOUNTER — Ambulatory Visit (INDEPENDENT_AMBULATORY_CARE_PROVIDER_SITE_OTHER): Payer: BLUE CROSS/BLUE SHIELD | Admitting: Family Medicine

## 2014-12-21 VITALS — BP 137/65 | HR 73 | Temp 98.3°F | Ht 64.0 in | Wt 150.5 lb

## 2014-12-21 DIAGNOSIS — R7309 Other abnormal glucose: Secondary | ICD-10-CM | POA: Diagnosis not present

## 2014-12-21 DIAGNOSIS — I10 Essential (primary) hypertension: Secondary | ICD-10-CM | POA: Diagnosis not present

## 2014-12-21 DIAGNOSIS — J42 Unspecified chronic bronchitis: Secondary | ICD-10-CM

## 2014-12-21 DIAGNOSIS — R7303 Prediabetes: Secondary | ICD-10-CM

## 2014-12-21 LAB — BASIC METABOLIC PANEL
BUN: 10 mg/dL (ref 7–25)
CO2: 25 mmol/L (ref 20–31)
Calcium: 10.4 mg/dL (ref 8.6–10.4)
Chloride: 104 mmol/L (ref 98–110)
Creat: 0.58 mg/dL (ref 0.50–1.05)
Glucose, Bld: 88 mg/dL (ref 65–99)
Potassium: 4.5 mmol/L (ref 3.5–5.3)
Sodium: 138 mmol/L (ref 135–146)

## 2014-12-21 LAB — POCT GLYCOSYLATED HEMOGLOBIN (HGB A1C): Hemoglobin A1C: 5.9

## 2014-12-21 NOTE — Patient Instructions (Signed)
From looking at the preferred drug list, it appears like both Nasonex and Advair are preferred medications. If these drugs are still too expensive, please ask your pharmacy to speak with the insurance company about which medication they would prefer- I will be happy to fill that instead. The best thing you can do for your breathing and health is to stop smoking! Call 1800-QUIT-NOW for help with stopping smoking.

## 2014-12-21 NOTE — Progress Notes (Signed)
Patient ID: IZABELA OW, female   DOB: 06-17-1955, 59 y.o.   MRN: [id number]    Subjective: CC: HTN HPI: Patient is a 59 y.o. female with a past medical history of HTN and chronic bronchitis presenting to clinic today for a f/u on HTN .  Hypertension Blood pressure at home: 120s/60s at drug store Blood pressure today: 137/65 Meds: Compliant with HCTZ and lisinopril (may miss 2 days /week)   Side effects: None ROS: Denies headache, dizziness, visual changes, nausea, vomiting, chest pain, abdominal pain or shortness of breath.  Chronic bronchitis Patient currently using Advair and albuterol PRN. States Nasonex is too expensive. Also states that Advair is approximately $200 and she'd like something cheaper. Stable cough. No excessive sputum production.   Social History:  1ppd  ROS: All other systems reviewed and are negative.  Past Medical History Patient Active Problem List   Diagnosis Date Noted  . Health maintenance examination 08/25/2014  . Onychomycosis 09/19/2013  . Soft tissue mass 09/19/2013  . Ingrown toenail without infection 06/27/2013  . Plantar fasciitis 06/27/2013  . Acid reflux 05/08/2012  . Positive fecal occult blood test 11/16/2011  . Tooth pain 10/27/2011  . Back pain 10/27/2011  . Hypertension 09/19/2011  . Systolic murmur 27/25/3664  . Obesity (BMI 30.0-34.9) 08/20/2011  . Chronic bronchitis 08/18/2011  . Tobacco use disorder 04/25/1971    Medications- reviewed and updated Current Outpatient Prescriptions  Medication Sig Dispense Refill  . albuterol (PROVENTIL HFA;VENTOLIN HFA) 108 (90 BASE) MCG/ACT inhaler Inhale 2 puffs into the lungs as needed. For wheezing/shortness of breath 1 Inhaler 2  . Fluticasone-Salmeterol (ADVAIR) 100-50 MCG/DOSE AEPB Inhale 1 puff into the lungs 2 (two) times daily. 1 each 3  . hydrochlorothiazide (HYDRODIURIL) 25 MG tablet TAKE 1 TABLET BY MOUTH EVERY DAY 90 tablet 2  . ibuprofen (ADVIL,MOTRIN) 600 MG tablet Take 1  tablet (600 mg total) by mouth every 8 (eight) hours as needed. 30 tablet 0  . lisinopril (PRINIVIL,ZESTRIL) 5 MG tablet Take 1 tablet (5 mg total) by mouth daily. 90 tablet 2  . loratadine (CLARITIN) 10 MG tablet Take 10 mg by mouth daily.    . mometasone (NASONEX) 50 MCG/ACT nasal spray Place 2 sprays into the nose daily. 17 g 12  . pravastatin (PRAVACHOL) 40 MG tablet Take 1 tablet (40 mg total) by mouth daily. 90 tablet 3  . ranitidine (ZANTAC) 150 MG tablet Take 1 tablet (150 mg total) by mouth 2 (two) times daily. 60 tablet 2  . terbinafine (LAMISIL) 250 MG tablet Take 1 tablet (250 mg total) by mouth daily. 90 tablet 0   No current facility-administered medications for this visit.    Objective: Office vital signs reviewed. BP 137/65 mmHg  Pulse 73  Temp(Src) 98.3 F (36.8 C) (Oral)  Ht 5\' 4"  (1.626 m)  Wt 150 lb 8 oz (68.266 kg)  BMI 25.82 kg/m2   Physical Examination:  General: Awake, alert, well- nourished, NAD ENMT:  TMs intact, normal light reflex, no erythema, no bulging. Nasal turbinates moist. MMM, Oropharynx clear without erythema or tonsillar exudate/hypertrophy Eyes: Conjunctiva non-injected. PERRL.  Cardio: RRR, no m/r/g noted. No pitting edema.  Pulm: No increased WOB.  CTAB, without wheezes, rhonchi or crackles noted.   Assessment/Plan: Hypertension BPs within normal limits without evidence of hypotension or other side effects.  - BMET today - Continue HCTZ and lisinopril  - follow up in 3 months.  Chronic bronchitis Stable. Per [employer] preferred drug list, both Advair and nasonex  are preferred medications. Continue to encourage smoking cessation and provided 1800 quit number. Patient to follow up for annual influenza vaccine    Orders Placed This Encounter  Procedures  . Basic Metabolic Panel  . HgB A1c    No orders of the defined types were placed in this encounter.    Archie Patten PGY-2, [place]

## 2014-12-22 ENCOUNTER — Encounter: Payer: Self-pay | Admitting: Family Medicine

## 2014-12-22 NOTE — Assessment & Plan Note (Signed)
BPs within normal limits without evidence of hypotension or other side effects.  - BMET today - Continue HCTZ and lisinopril  - follow up in 3 months.

## 2014-12-22 NOTE — Assessment & Plan Note (Addendum)
Stable. Per [employer] preferred drug list, both Advair and nasonex are preferred medications. Continue to encourage smoking cessation and provided 1800 quit number. Patient to follow up for annual influenza vaccine

## 2015-02-01 ENCOUNTER — Other Ambulatory Visit: Payer: Self-pay | Admitting: *Deleted

## 2015-02-01 MED ORDER — PRAVASTATIN SODIUM 40 MG PO TABS: 40.0000 mg | ORAL_TABLET | Freq: Every day | ORAL | Status: DC

## 2015-02-03 ENCOUNTER — Telehealth: Payer: Self-pay | Admitting: Family Medicine

## 2015-02-03 MED ORDER — PRAVASTATIN SODIUM 40 MG PO TABS: 40.0000 mg | ORAL_TABLET | Freq: Every day | ORAL | Status: DC

## 2015-02-03 NOTE — Telephone Encounter (Signed)
Patient use [facility] on [address] for her rxs

## 2015-02-03 NOTE — Telephone Encounter (Signed)
Patient calling for refill on Pravastatin.  Medication was approved but stated print.  Rx resent to [facility] electronically.  Derl Barrow, RN

## 2015-02-11 ENCOUNTER — Other Ambulatory Visit: Payer: Self-pay | Admitting: Family Medicine

## 2015-02-11 NOTE — Telephone Encounter (Signed)
Rx called into [facility]

## 2015-02-11 NOTE — Telephone Encounter (Signed)
Pt called because her Pravastatin is still not at her pharmacy. We sent this on 02/03/15 but the pharmacy never received it. Can we re-send. jw

## 2015-05-13 ENCOUNTER — Telehealth: Payer: Self-pay | Admitting: *Deleted

## 2015-05-13 NOTE — Telephone Encounter (Signed)
Called patient to offer flu vaccine, however, daughter picked up.  Left message with patient's daughter to return call. Velora Heckler, RN

## 2015-05-21 ENCOUNTER — Encounter: Payer: Self-pay | Admitting: Family Medicine

## 2015-05-21 ENCOUNTER — Ambulatory Visit (INDEPENDENT_AMBULATORY_CARE_PROVIDER_SITE_OTHER): Payer: 59 | Admitting: Family Medicine

## 2015-05-21 VITALS — BP 125/65 | HR 72 | Temp 98.3°F | Ht 64.0 in | Wt 144.0 lb

## 2015-05-21 DIAGNOSIS — I10 Essential (primary) hypertension: Secondary | ICD-10-CM | POA: Diagnosis not present

## 2015-05-21 DIAGNOSIS — B351 Tinea unguium: Secondary | ICD-10-CM | POA: Diagnosis not present

## 2015-05-21 DIAGNOSIS — M546 Pain in thoracic spine: Secondary | ICD-10-CM

## 2015-05-21 LAB — BASIC METABOLIC PANEL
BUN: 13 mg/dL (ref 7–25)
CO2: 26 mmol/L (ref 20–31)
Calcium: 10 mg/dL (ref 8.6–10.4)
Chloride: 100 mmol/L (ref 98–110)
Creat: 0.61 mg/dL (ref 0.50–1.05)
Glucose, Bld: 84 mg/dL (ref 65–99)
Potassium: 4.6 mmol/L (ref 3.5–5.3)
Sodium: 135 mmol/L (ref 135–146)

## 2015-05-21 MED ORDER — PRAVASTATIN SODIUM 40 MG PO TABS: 40.0000 mg | ORAL_TABLET | Freq: Every day | ORAL | Status: DC

## 2015-05-21 MED ORDER — IBUPROFEN 600 MG PO TABS: 600.0000 mg | ORAL_TABLET | Freq: Three times a day (TID) | ORAL | Status: DC | PRN

## 2015-05-21 MED ORDER — HYDROCHLOROTHIAZIDE 25 MG PO TABS: ORAL_TABLET | ORAL | Status: DC

## 2015-05-21 MED ORDER — LISINOPRIL 5 MG PO TABS: 5.0000 mg | ORAL_TABLET | Freq: Every day | ORAL | Status: DC

## 2015-05-21 MED ORDER — TERBINAFINE HCL 1 % EX CREA: 1.0000 "application " | TOPICAL_CREAM | Freq: Two times a day (BID) | CUTANEOUS | Status: DC

## 2015-05-21 MED ORDER — MOMETASONE FUROATE 50 MCG/ACT NA SUSP: 2.0000 | Freq: Every day | NASAL | Status: DC

## 2015-05-21 MED ORDER — FLUTICASONE-SALMETEROL 100-50 MCG/DOSE IN AEPB
1.0000 | INHALATION_SPRAY | Freq: Two times a day (BID) | RESPIRATORY_TRACT | Status: DC
Start: 2015-05-21 — End: 2020-12-21

## 2015-05-21 NOTE — Patient Instructions (Signed)
I have prescribed you some Lamisil cream to put on your feet for the fungus, it will most likely not help too much for the toenail. Your blood pressure looks great. We're getting some labwork today. If it is NOT normal I will call you, otherwise I send you a letter.

## 2015-05-21 NOTE — Assessment & Plan Note (Signed)
Stable. Patient had L great toenail removed and noted they stated it "shouldn't grow back," however has now grown back abnormally. Would like to leave the toenails alone for now. More bothered by the fungal infection around the toenails. No evidence of superimposed bacterial infection. - Rx for lamisil cream, discussed this would not help toenails - continue to monitor

## 2015-05-21 NOTE — Assessment & Plan Note (Signed)
Sounds like muscle spasms. No pain currently. No red flags on exam or history. Normal strength and sensation. - continue ice and heat as needed - Ibuprofen as needed at work. - back strengthening exercises. - red flags discussed, return as needed.

## 2015-05-21 NOTE — Progress Notes (Signed)
Patient ID: Sabrina [name], female   DOB: January 03, 1956, 60 y.o.   MRN: [id number]    Subjective: CC: HTN HPI: Patient is a 60 y.o. female with a past medical history of HTN and chronic bronchitis presenting to clinic today for a f/u on HTN .  Hypertension Blood pressure at home: doesn't take at home Blood pressure today: 125/65  Meds: Compliant with HCTZ and lisinopril  Side effects: None ROS: Denies headache, dizziness, visual changes, nausea, vomiting, chest pain, abdominal pain or shortness of breath.  Back pain: notice it mostly when she bends over and carrying heavy things. Mostly in lower back and sometimes her gluteal region is involved. Pain is more severe in the middle of the day. The motrin eases the pain significantly.  No radiation of pain. No saddle paresthesias or urinary or bowel incontinence.  No fevers or steroid use. No back pain currently.   Social History:  1ppd, works at [employer] for [employer]: All other systems reviewed and are negative except that noted in HPI.  Past Medical History Patient Active Problem List   Diagnosis Date Noted  . Health maintenance examination 08/25/2014  . Onychomycosis 09/19/2013  . Soft tissue mass 09/19/2013  . Ingrown toenail without infection 06/27/2013  . Plantar fasciitis 06/27/2013  . Acid reflux 05/08/2012  . Positive fecal occult blood test 11/16/2011  . Tooth pain 10/27/2011  . Back pain 10/27/2011  . Hypertension 09/19/2011  . Systolic murmur 123456  . Obesity (BMI 30.0-34.9) 08/20/2011  . Chronic bronchitis ([place]) 08/18/2011  . Tobacco use disorder 04/25/1971    Medications- reviewed and updated Current Outpatient Prescriptions  Medication Sig Dispense Refill  . albuterol (PROVENTIL HFA;VENTOLIN HFA) 108 (90 BASE) MCG/ACT inhaler Inhale 2 puffs into the lungs as needed. For wheezing/shortness of breath 1 Inhaler 2  . Fluticasone-Salmeterol (ADVAIR) 100-50 MCG/DOSE AEPB Inhale 1 puff into the lungs 2 (two) times  daily. 1 each 3  . hydrochlorothiazide (HYDRODIURIL) 25 MG tablet TAKE 1 TABLET BY MOUTH EVERY DAY 90 tablet 2  . ibuprofen (ADVIL,MOTRIN) 600 MG tablet Take 1 tablet (600 mg total) by mouth every 8 (eight) hours as needed. 30 tablet 0  . lisinopril (PRINIVIL,ZESTRIL) 5 MG tablet Take 1 tablet (5 mg total) by mouth daily. 90 tablet 2  . loratadine (CLARITIN) 10 MG tablet Take 10 mg by mouth daily.    . mometasone (NASONEX) 50 MCG/ACT nasal spray Place 2 sprays into the nose daily. 17 g 12  . pravastatin (PRAVACHOL) 40 MG tablet Take 1 tablet (40 mg total) by mouth daily. 90 tablet 3  . ranitidine (ZANTAC) 150 MG tablet Take 1 tablet (150 mg total) by mouth 2 (two) times daily. 60 tablet 2  . terbinafine (LAMISIL AT) 1 % cream Apply 1 application topically 2 (two) times daily. 30 g 0  . terbinafine (LAMISIL) 250 MG tablet Take 1 tablet (250 mg total) by mouth daily. 90 tablet 0   No current facility-administered medications for this visit.    Objective: Office vital signs reviewed. BP 125/65 mmHg  Pulse 72  Temp(Src) 98.3 F (36.8 C) (Oral)  Ht 5\' 4"  (1.626 m)  Wt 144 lb (65.318 kg)  BMI 24.71 kg/m2   Physical Examination:  General: Awake, alert, well- nourished, NAD Cardio: RRR, no m/r/g noted. No pitting edema.  Pulm: No increased WOB.  CTAB, without wheezes, rhonchi or crackles noted.  MSK: Back: no deformities noted. No tenderness over the spinous processes. No tenderness over the paraspinal muscles.  Negative SLR. Neuro: 5/5 strength in the LE bilaterally. Sensation grossly intact in the LE. Toes: thickened hypertrophic nails, brittle. L great toenail not completely attached to nail bed. Fungal infection surrounding toenails, patient pulling at thickened skin on L great toe.    Assessment/Plan: Hypertension Stable. No side effects.  - continue HCTZ and lisinopril - repeat BMET  Onychomycosis Stable. Patient had L great toenail removed and noted they stated it "shouldn't  grow back," however has now grown back abnormally. Would like to leave the toenails alone for now. More bothered by the fungal infection around the toenails. No evidence of superimposed bacterial infection. - Rx for lamisil cream, discussed this would not help toenails - continue to monitor   Back pain Sounds like muscle spasms. No pain currently. No red flags on exam or history. Normal strength and sensation. - continue ice and heat as needed - Ibuprofen as needed at work. - back strengthening exercises. - red flags discussed, return as needed.    Orders Placed This Encounter  Procedures  . Basic Metabolic Panel    Meds ordered this encounter  Medications  . Fluticasone-Salmeterol (ADVAIR) 100-50 MCG/DOSE AEPB    Sig: Inhale 1 puff into the lungs 2 (two) times daily.    Dispense:  1 each    Refill:  3  . hydrochlorothiazide (HYDRODIURIL) 25 MG tablet    Sig: TAKE 1 TABLET BY MOUTH EVERY DAY    Dispense:  90 tablet    Refill:  2  . lisinopril (PRINIVIL,ZESTRIL) 5 MG tablet    Sig: Take 1 tablet (5 mg total) by mouth daily.    Dispense:  90 tablet    Refill:  2  . DISCONTD: ibuprofen (ADVIL,MOTRIN) 600 MG tablet    Sig: Take 1 tablet (600 mg total) by mouth every 8 (eight) hours as needed.    Dispense:  30 tablet    Refill:  0  . mometasone (NASONEX) 50 MCG/ACT nasal spray    Sig: Place 2 sprays into the nose daily.    Dispense:  17 g    Refill:  12  . pravastatin (PRAVACHOL) 40 MG tablet    Sig: Take 1 tablet (40 mg total) by mouth daily.    Dispense:  90 tablet    Refill:  3  . terbinafine (LAMISIL AT) 1 % cream    Sig: Apply 1 application topically 2 (two) times daily.    Dispense:  30 g    Refill:  0  . ibuprofen (ADVIL,MOTRIN) 600 MG tablet    Sig: Take 1 tablet (600 mg total) by mouth every 8 (eight) hours as needed.    Dispense:  30 tablet    Refill:  [facility] PGY-2, [place]

## 2015-05-21 NOTE — Assessment & Plan Note (Signed)
Stable. No side effects.  - continue HCTZ and lisinopril - repeat BMET

## 2015-05-25 ENCOUNTER — Encounter: Payer: Self-pay | Admitting: Family Medicine

## 2015-06-17 NOTE — Telephone Encounter (Signed)
Called patient to offer flu vaccine Left message with patient's grandson to return call. Velora Heckler, RN

## 2015-10-19 ENCOUNTER — Other Ambulatory Visit: Payer: Self-pay | Admitting: *Deleted

## 2015-10-19 MED ORDER — ALBUTEROL SULFATE HFA 108 (90 BASE) MCG/ACT IN AERS: 2.0000 | INHALATION_SPRAY | RESPIRATORY_TRACT | Status: DC | PRN

## 2015-11-08 ENCOUNTER — Other Ambulatory Visit: Payer: Self-pay | Admitting: Family Medicine

## 2015-11-08 DIAGNOSIS — Z139 Encounter for screening, unspecified: Secondary | ICD-10-CM

## 2015-11-15 ENCOUNTER — Ambulatory Visit: Payer: 59 | Admitting: Family Medicine

## 2015-11-16 ENCOUNTER — Other Ambulatory Visit: Payer: Self-pay | Admitting: Family Medicine

## 2015-11-16 ENCOUNTER — Encounter: Payer: Self-pay | Admitting: Family Medicine

## 2015-11-16 ENCOUNTER — Ambulatory Visit (INDEPENDENT_AMBULATORY_CARE_PROVIDER_SITE_OTHER): Payer: 59 | Admitting: Family Medicine

## 2015-11-16 VITALS — BP 123/63 | HR 80 | Temp 98.2°F | Wt 142.0 lb

## 2015-11-16 DIAGNOSIS — M545 Low back pain, unspecified: Secondary | ICD-10-CM

## 2015-11-16 DIAGNOSIS — Z1159 Encounter for screening for other viral diseases: Secondary | ICD-10-CM | POA: Diagnosis not present

## 2015-11-16 DIAGNOSIS — R7303 Prediabetes: Secondary | ICD-10-CM | POA: Diagnosis not present

## 2015-11-16 LAB — POCT GLYCOSYLATED HEMOGLOBIN (HGB A1C): Hemoglobin A1C: 5.8

## 2015-11-16 MED ORDER — FLUTICASONE PROPIONATE 50 MCG/ACT NA SUSP: 2.0000 | Freq: Every day | NASAL | 1 refills | Status: DC

## 2015-11-16 MED ORDER — CYCLOBENZAPRINE HCL 10 MG PO TABS: 10.0000 mg | ORAL_TABLET | Freq: Every evening | ORAL | 0 refills | Status: DC | PRN

## 2015-11-16 NOTE — Patient Instructions (Addendum)
Start doing stretches for the lower back pain During the day, take ibuprofen as needed for the back pain At nighttime, you can take Flexeril as needed for the spasms. It can make you drowsy so do not take it during the day If you no worsening shortness of breath, chest pain, fever, or worsening of your sputum production follow-up with [place] Follow-up with me in 6 months or sooner as needed   Back Pain, Adult Back pain is very common in adults.The cause of back pain is rarely dangerous and the pain often gets better over time.The cause of your back pain may not be known. Some common causes of back pain include:  Strain of the muscles or ligaments supporting the spine.  Wear and tear (degeneration) of the spinal disks.  Arthritis.  Direct injury to the back. For many people, back pain may return. Since back pain is rarely dangerous, most people can learn to manage this condition on their own. HOME CARE INSTRUCTIONS Watch your back pain for any changes. The following actions may help to lessen any discomfort you are feeling:  Remain active. It is stressful on your back to sit or stand in one place for long periods of time. Do not sit, drive, or stand in one place for more than 30 minutes at a time. Take short walks on even surfaces as soon as you are able.Try to increase the length of time you walk each day.  Exercise regularly as directed by your health care provider. Exercise helps your back heal faster. It also helps avoid future injury by keeping your muscles strong and flexible.  Do not stay in bed.Resting more than 1-2 days can delay your recovery.  Pay attention to your body when you bend and lift. The most comfortable positions are those that put less stress on your recovering back. Always use proper lifting techniques, including:  Bending your knees.  Keeping the load close to your body.  Avoiding twisting.  Find a comfortable position to sleep. Use a firm mattress and lie  on your side with your knees slightly bent. If you lie on your back, put a pillow under your knees.  Avoid feeling anxious or stressed.Stress increases muscle tension and can worsen back pain.It is important to recognize when you are anxious or stressed and learn ways to manage it, such as with exercise.  Take medicines only as directed by your health care provider. Over-the-counter medicines to reduce pain and inflammation are often the most helpful.Your health care provider may prescribe muscle relaxant drugs.These medicines help dull your pain so you can more quickly return to your normal activities and healthy exercise.  Apply ice to the injured area:  Put ice in a plastic bag.  Place a towel between your skin and the bag.  Leave the ice on for 20 minutes, 2-3 times a day for the first 2-3 days. After that, ice and heat may be alternated to reduce pain and spasms.  Maintain a healthy weight. Excess weight puts extra stress on your back and makes it difficult to maintain good posture. SEEK MEDICAL CARE IF:  You have pain that is not relieved with rest or medicine.  You have increasing pain going down into the legs or buttocks.  You have pain that does not improve in one week.  You have night pain.  You lose weight.  You have a fever or chills. SEEK IMMEDIATE MEDICAL CARE IF:   You develop new bowel or bladder control problems.  You have unusual weakness or numbness in your arms or legs.  You develop nausea or vomiting.  You develop abdominal pain.  You feel faint.   This information is not intended to replace advice given to you by your health care provider. Make sure you discuss any questions you have with your health care provider.   Document Released: 04/10/2005 Document Revised: 05/01/2014 Document Reviewed: 08/12/2013 Elsevier Interactive Patient Education [employer].

## 2015-11-16 NOTE — Progress Notes (Signed)
Subjective: CC: f/u back pain HPI: Patient is a 60 y.o. female with a past medical history of chronic bronchitis and HTN presenting to clinic today for a f/u on BP.  Hypertension Blood pressure at home: doesn't take at home Blood pressure today: 123/63 Meds: Compliant with HCTZ and lisinopril  Side effects: None ROS: Denies headache, dizziness, visual changes, nausea, vomiting, chest pain, abdominal pain or shortness of breath.  Nasal congestion: thick nasal congestion. She endorses rhinorrhea. She is coughing up thick sputum that is white/yellow. No fevers/chills.  She does use the Advair.  She hasn't used albuterol. Mild headache. No SOB, chest pain, vision change.   Lumbar back pain: stable from previous. Bilaterally. Feels like it is worse during the middle of the day. She has been using Ibuprofen but it hasn't helped. She notes it sometimes keeps her up at nights. Her work entails picking up heavy boxes which isn't helping. She denies urinary/bowel incontinence. No saddle paresthesias.   Social History: current smoker  Health Maintenance: due for hep C and HIV testing.   ROS: All other systems reviewed and are negative.  Past Medical History Patient Active Problem List   Diagnosis Date Noted  . Health maintenance examination 08/25/2014  . Onychomycosis 09/19/2013  . Soft tissue mass 09/19/2013  . Ingrown toenail without infection 06/27/2013  . Plantar fasciitis 06/27/2013  . Acid reflux 05/08/2012  . Positive fecal occult blood test 11/16/2011  . Tooth pain 10/27/2011  . Back pain 10/27/2011  . Hypertension 09/19/2011  . Systolic murmur 123456  . Obesity (BMI 30.0-34.9) 08/20/2011  . Chronic bronchitis ([place]) 08/18/2011  . Tobacco use disorder 04/25/1971    Medications- reviewed and updated Current Outpatient Prescriptions  Medication Sig Dispense Refill  . albuterol (PROVENTIL HFA;VENTOLIN HFA) 108 (90 Base) MCG/ACT inhaler Inhale 2 puffs into the lungs as  needed. For wheezing/shortness of breath 1 Inhaler 2  . cyclobenzaprine (FLEXERIL) 10 MG tablet Take 1 tablet (10 mg total) by mouth at bedtime as needed for muscle spasms. 20 tablet 0  . fluticasone (FLONASE) 50 MCG/ACT nasal spray Place 2 sprays into both nostrils daily. 16 g 1  . Fluticasone-Salmeterol (ADVAIR) 100-50 MCG/DOSE AEPB Inhale 1 puff into the lungs 2 (two) times daily. 1 each 3  . hydrochlorothiazide (HYDRODIURIL) 25 MG tablet TAKE 1 TABLET BY MOUTH EVERY DAY 90 tablet 2  . ibuprofen (ADVIL,MOTRIN) 600 MG tablet Take 1 tablet (600 mg total) by mouth every 8 (eight) hours as needed. 30 tablet 0  . lisinopril (PRINIVIL,ZESTRIL) 5 MG tablet Take 1 tablet (5 mg total) by mouth daily. 90 tablet 2  . loratadine (CLARITIN) 10 MG tablet Take 10 mg by mouth daily.    . pravastatin (PRAVACHOL) 40 MG tablet Take 1 tablet (40 mg total) by mouth daily. 90 tablet 3  . ranitidine (ZANTAC) 150 MG tablet Take 1 tablet (150 mg total) by mouth 2 (two) times daily. 60 tablet 2  . terbinafine (LAMISIL AT) 1 % cream Apply 1 application topically 2 (two) times daily. 30 g 0  . terbinafine (LAMISIL) 250 MG tablet Take 1 tablet (250 mg total) by mouth daily. 90 tablet 0   No current facility-administered medications for this visit.     Objective: Office vital signs reviewed. BP 123/63 (BP Location: Left Arm, Patient Position: Sitting, Cuff Size: Normal)   Pulse 80   Temp 98.2 F (36.8 C) (Oral)   Wt 142 lb (64.4 kg)   BMI 24.37 kg/m  Physical Examination:  General: Awake, alert, well- nourished, NAD ENMT:  TMs intact, normal light reflex, no erythema, no bulging. Nasal turbinates moist. MMM, Oropharynx clear without erythema or tonsillar exudate/hypertrophy Cardio: RRR, no m/r/g noted.  Pulm: No increased WOB.  CTAB, without wheezes, rhonchi or crackles noted.  Back: Normal to inspection. No tenderness to palpation. Full ROM. 5/5 strength in the LEs bilaterally. Sensation grossly intact.   Negative SLR.   Assessment/Plan: Back pain History consistent with muscle spasms. No red flags on exam or history.  - continue heat and stretching (pt has not been stretching). - continue Ibuprofen as needed at work  - can start Flexeril qHS PRN muscle pain - discussed red flags and reasons to return to clinic.   Nasal congestion: will attempt Flonase, pt has been on Nasonex in the past but hasn't had it refilled in some time due to cost. Return precautions discussed.   Health maintenance: Performed Hep C and HIV testing today.  Orders Placed This Encounter  Procedures  . Hepatitis C antibody  . HIV antibody  . POCT glycosylated hemoglobin (Hb A1C)    Meds ordered this encounter  Medications  . cyclobenzaprine (FLEXERIL) 10 MG tablet    Sig: Take 1 tablet (10 mg total) by mouth at bedtime as needed for muscle spasms.    Dispense:  20 tablet    Refill:  0  . fluticasone (FLONASE) 50 MCG/ACT nasal spray    Sig: Place 2 sprays into both nostrils daily.    Dispense:  16 g    Refill:  [facility] PGY-3, [place]

## 2015-11-17 LAB — HEPATITIS C ANTIBODY: HCV Ab: NEGATIVE

## 2015-11-17 LAB — HIV ANTIBODY (ROUTINE TESTING W REFLEX): HIV 1&2 Ab, 4th Generation: NONREACTIVE

## 2015-11-17 NOTE — Assessment & Plan Note (Signed)
History consistent with muscle spasms. No red flags on exam or history.  - continue heat and stretching (pt has not been stretching). - continue Ibuprofen as needed at work  - can start Flexeril qHS PRN muscle pain - discussed red flags and reasons to return to clinic.

## 2015-11-19 ENCOUNTER — Telehealth: Payer: Self-pay | Admitting: Family Medicine

## 2015-11-19 ENCOUNTER — Ambulatory Visit
Admission: RE | Admit: 2015-11-19 | Discharge: 2015-11-19 | Disposition: A | Discharge status: Home / Self Care | Payer: 59 | Source: Ambulatory Visit | Attending: Family Medicine | Admitting: Family Medicine

## 2015-11-19 DIAGNOSIS — Z139 Encounter for screening, unspecified: Secondary | ICD-10-CM

## 2015-11-19 NOTE — Telephone Encounter (Signed)
Please call and let the patient know that both Hepatitis C and HIV were negative as we suspected.  Thanks, Archie Patten, MD [facility] Resident  11/19/2015, 9:24 AM

## 2015-11-19 NOTE — Telephone Encounter (Signed)
Tried calling patient, no answer, no voicemail

## 2016-04-11 ENCOUNTER — Other Ambulatory Visit: Payer: Self-pay | Admitting: Family Medicine

## 2016-04-11 DIAGNOSIS — I10 Essential (primary) hypertension: Secondary | ICD-10-CM

## 2016-04-11 MED ORDER — LISINOPRIL 5 MG PO TABS: 5.0000 mg | ORAL_TABLET | Freq: Every day | ORAL | 2 refills | Status: DC

## 2016-04-11 MED ORDER — HYDROCHLOROTHIAZIDE 25 MG PO TABS: ORAL_TABLET | ORAL | 2 refills | Status: DC

## 2016-04-11 NOTE — Telephone Encounter (Signed)
Pt needs refills on her BP medications. Pt uses [facility]. Please advise. Thanks! ep

## 2016-05-21 ENCOUNTER — Other Ambulatory Visit: Payer: Self-pay | Admitting: Family Medicine

## 2016-10-21 ENCOUNTER — Emergency Department (HOSPITAL_COMMUNITY)
Admission: EM | Admit: 2016-10-21 | Discharge: 2016-10-21 | Disposition: A | Discharge status: Home / Self Care | Payer: BLUE CROSS/BLUE SHIELD | Attending: Emergency Medicine | Admitting: Emergency Medicine

## 2016-10-21 ENCOUNTER — Emergency Department (HOSPITAL_COMMUNITY): Payer: BLUE CROSS/BLUE SHIELD

## 2016-10-21 ENCOUNTER — Encounter (HOSPITAL_COMMUNITY): Payer: Self-pay | Admitting: Emergency Medicine

## 2016-10-21 DIAGNOSIS — I10 Essential (primary) hypertension: Secondary | ICD-10-CM | POA: Diagnosis not present

## 2016-10-21 DIAGNOSIS — K529 Noninfective gastroenteritis and colitis, unspecified: Secondary | ICD-10-CM

## 2016-10-21 DIAGNOSIS — E785 Hyperlipidemia, unspecified: Secondary | ICD-10-CM | POA: Insufficient documentation

## 2016-10-21 DIAGNOSIS — Z79899 Other long term (current) drug therapy: Secondary | ICD-10-CM | POA: Diagnosis not present

## 2016-10-21 DIAGNOSIS — F17210 Nicotine dependence, cigarettes, uncomplicated: Secondary | ICD-10-CM | POA: Insufficient documentation

## 2016-10-21 DIAGNOSIS — R1030 Lower abdominal pain, unspecified: Secondary | ICD-10-CM | POA: Diagnosis present

## 2016-10-21 HISTORY — DX: Essential (primary) hypertension: I10

## 2016-10-21 LAB — COMPREHENSIVE METABOLIC PANEL
ALT: 15 U/L (ref 14–54)
AST: 18 U/L (ref 15–41)
Albumin: 3.6 g/dL (ref 3.5–5.0)
Alkaline Phosphatase: 97 U/L (ref 38–126)
Anion gap: 6 (ref 5–15)
BUN: 6 mg/dL (ref 6–20)
CO2: 24 mmol/L (ref 22–32)
Calcium: 9.7 mg/dL (ref 8.9–10.3)
Chloride: 107 mmol/L (ref 101–111)
Creatinine, Ser: 0.68 mg/dL (ref 0.44–1.00)
GFR calc Af Amer: >60 mL/min (ref >60)
GFR calc non Af Amer: >60 mL/min (ref >60)
Glucose, Bld: 148 mg/dL — ABNORMAL HIGH (ref 65–99)
Potassium: 3.5 mmol/L (ref 3.5–5.1)
Sodium: 137 mmol/L (ref 135–145)
Total Bilirubin: 0.4 mg/dL (ref 0.3–1.2)
Total Protein: 7.9 g/dL (ref 6.5–8.1)

## 2016-10-21 LAB — CBC
HCT: 42.6 % (ref 36.0–46.0)
Hemoglobin: 14 g/dL (ref 12.0–15.0)
MCH: 30.6 pg (ref 26.0–34.0)
MCHC: 32.9 g/dL (ref 30.0–36.0)
MCV: 93.2 fL (ref 78.0–100.0)
Platelets: 280 10*3/uL (ref 150–400)
RBC: 4.57 MIL/uL (ref 3.87–5.11)
RDW: 12.8 % (ref 11.5–15.5)
WBC: 11.6 10*3/uL — ABNORMAL HIGH (ref 4.0–10.5)

## 2016-10-21 LAB — URINALYSIS, ROUTINE W REFLEX MICROSCOPIC
Bilirubin Urine: NEGATIVE
Glucose, UA: NEGATIVE mg/dL
Ketones, ur: NEGATIVE mg/dL
Leukocytes, UA: NEGATIVE
Nitrite: NEGATIVE
Protein, ur: NEGATIVE mg/dL
Specific Gravity, Urine: 1.039 — ABNORMAL HIGH (ref 1.005–1.030)
pH: 6 (ref 5.0–8.0)

## 2016-10-21 LAB — LIPASE, BLOOD: Lipase: 24 U/L (ref 11–51)

## 2016-10-21 MED ORDER — MORPHINE SULFATE (PF) 4 MG/ML IV SOLN
4.0000 mg | Freq: Once | INTRAVENOUS | Status: AC
  Administered 2016-10-21: 4 mg via INTRAVENOUS
  Filled 2016-10-21: qty 1

## 2016-10-21 MED ORDER — ONDANSETRON HCL 4 MG/2ML IJ SOLN
4.0000 mg | Freq: Once | INTRAMUSCULAR | Status: AC
  Administered 2016-10-21: 4 mg via INTRAVENOUS
  Filled 2016-10-21: qty 2

## 2016-10-21 MED ORDER — CIPROFLOXACIN HCL 500 MG PO TABS: 500.0000 mg | ORAL_TABLET | Freq: Two times a day (BID) | ORAL | 0 refills | Status: DC

## 2016-10-21 MED ORDER — SODIUM CHLORIDE 0.9 % IV BOLUS (SEPSIS)
1000.0000 mL | Freq: Once | INTRAVENOUS | Status: AC
  Administered 2016-10-21: 1000 mL via INTRAVENOUS

## 2016-10-21 MED ORDER — METRONIDAZOLE 500 MG PO TABS: 500.0000 mg | ORAL_TABLET | Freq: Two times a day (BID) | ORAL | 0 refills | Status: DC

## 2016-10-21 MED ORDER — IOPAMIDOL (ISOVUE-300) INJECTION 61%
INTRAVENOUS | Status: AC
  Administered 2016-10-21: 100 mL via INTRAVENOUS
  Filled 2016-10-21: qty 100

## 2016-10-21 MED ORDER — MORPHINE SULFATE 15 MG PO TABS: 15.0000 mg | ORAL_TABLET | ORAL | 0 refills | Status: DC | PRN

## 2016-10-21 MED ORDER — ONDANSETRON 4 MG PO TBDP: 4.0000 mg | ORAL_TABLET | Freq: Three times a day (TID) | ORAL | 0 refills | Status: DC | PRN

## 2016-10-21 MED ORDER — OXYCODONE-ACETAMINOPHEN 5-325 MG PO TABS
1.0000 | ORAL_TABLET | Freq: Once | ORAL | Status: AC
  Administered 2016-10-21: 1 via ORAL
  Filled 2016-10-21: qty 1

## 2016-10-21 NOTE — ED Notes (Signed)
Patient transported to CT 

## 2016-10-21 NOTE — ED Provider Notes (Signed)
Sabrina [name]   Sabrina [name]: [id number] Arrival date & time: 10/21/16  1635     History   Chief Complaint Chief Complaint  Patient presents with  . Abdominal Pain    HPI Sabrina [name] is a 61 y.o. female.  61 yo F with a chief complaints of crampy lower abdominal pain. Going on for the past 4 days. Associated with some diarrhea. Denies vomiting denies fevers. Feels that the pain is worsened over the past couple days. Prior colonoscopy without issue. Has a prior cholecystectomy. Right oophorectomy.   The history is provided by the patient.  Abdominal Pain   This is a new problem. The current episode started more than 2 days ago (4 days). The problem occurs constantly. The problem has been gradually worsening. The pain is located in the LLQ, RLQ and suprapubic region. The quality of the pain is cramping. The pain is at a severity of 8/10. The pain is severe. Associated symptoms include diarrhea. Pertinent negatives include fever, nausea, vomiting, dysuria, headaches, arthralgias and myalgias. Nothing aggravates the symptoms. Nothing relieves the symptoms.    Past Medical History:  Diagnosis Date  . Allergy   . Chronic bronchitis 1991  . Hyperlipidemia    not on any current medication. Had been on lipitor in the past  . Hypertension   . Musculoskeletal chest pain     Patient Active Problem List   Diagnosis Date Noted  . Health maintenance examination 08/25/2014  . Onychomycosis 09/19/2013  . Soft tissue mass 09/19/2013  . Ingrown toenail without infection 06/27/2013  . Plantar fasciitis 06/27/2013  . Acid reflux 05/08/2012  . Positive fecal occult blood test 11/16/2011  . Tooth pain 10/27/2011  . Back pain 10/27/2011  . Hypertension 09/19/2011  . Systolic murmur 02/54/2706  . Obesity (BMI 30.0-34.9) 08/20/2011  . Chronic bronchitis ([place]) 08/18/2011  . Tobacco use disorder 04/25/1971    Past Surgical History:  Procedure Laterality Date  . ABDOMINAL  HYSTERECTOMY  1981   removal of right ovary and uterus for infection of the uterus  . CHOLECYSTECTOMY     1979    OB History    No data available       Home Medications    Prior to Admission medications   Medication Sig Start Date End Date Taking? Authorizing Provider  albuterol (PROVENTIL HFA;VENTOLIN HFA) 108 (90 Base) MCG/ACT inhaler Inhale 2 puffs into the lungs as needed. For wheezing/shortness of breath 10/19/15   Archie Patten, MD  ciprofloxacin (CIPRO) 500 MG tablet Take 1 tablet (500 mg total) by mouth 2 (two) times daily. 10/21/16   Deno Etienne, DO  cyclobenzaprine (FLEXERIL) 10 MG tablet Take 1 tablet (10 mg total) by mouth at bedtime as needed for muscle spasms. 11/16/15   Archie Patten, MD  fluticasone (FLONASE) 50 MCG/ACT nasal spray Place 2 sprays into both nostrils daily. 11/16/15   Archie Patten, MD  Fluticasone-Salmeterol (ADVAIR) 100-50 MCG/DOSE AEPB Inhale 1 puff into the lungs 2 (two) times daily. 05/21/15   Archie Patten, MD  hydrochlorothiazide (HYDRODIURIL) 25 MG tablet TAKE 1 TABLET BY MOUTH EVERY DAY 04/11/16   Archie Patten, MD  ibuprofen (ADVIL,MOTRIN) 600 MG tablet Take 1 tablet (600 mg total) by mouth every 8 (eight) hours as needed. 05/21/15   Archie Patten, MD  lisinopril (PRINIVIL,ZESTRIL) 5 MG tablet Take 1 tablet (5 mg total) by mouth daily. 04/11/16   Archie Patten, MD  loratadine (CLARITIN) 10 MG tablet Take  10 mg by mouth daily.    [provider]  metroNIDAZOLE (FLAGYL) 500 MG tablet Take 1 tablet (500 mg total) by mouth 2 (two) times daily. 10/21/16   Deno Etienne, DO  morphine (MSIR) 15 MG tablet Take 1 tablet (15 mg total) by mouth every 4 (four) hours as needed for severe pain. 10/21/16   Deno Etienne, DO  ondansetron (ZOFRAN ODT) 4 MG disintegrating tablet Take 1 tablet (4 mg total) by mouth every 8 (eight) hours as needed for nausea or vomiting. 10/21/16   Deno Etienne, DO  pravastatin (PRAVACHOL) 40 MG tablet take 1 tablet  by mouth once daily 05/22/16   Mayo, Pete Pelt, MD  ranitidine (ZANTAC) 150 MG tablet Take 1 tablet (150 mg total) by mouth 2 (two) times daily. 05/07/12   Losq, Burnell Blanks, MD  terbinafine (LAMISIL AT) 1 % cream Apply 1 application topically 2 (two) times daily. 05/21/15   Archie Patten, MD  terbinafine (LAMISIL) 250 MG tablet Take 1 tablet (250 mg total) by mouth daily. 12/15/13   Archie Patten, MD    Family History Family History  Problem Relation Age of Onset  . Sickle cell anemia Other   . Alzheimer's disease Mother   . Hyperlipidemia Mother   . Hypertension Mother   . Emphysema Father   . Alcohol abuse Sister   . Asthma Brother   . Cervical cancer Daughter     Social History Social History  Substance Use Topics  . Smoking status: Current Every Day Smoker    Packs/day: 1.00    Types: Cigarettes  . Smokeless tobacco: Never Used     Comment: decreased smoking  . Alcohol use 0.0 oz/week     Allergies   Patient has no known allergies.   Review of Systems Review of Systems  Constitutional: Negative for chills and fever.  HENT: Negative for congestion and rhinorrhea.   Eyes: Negative for redness and visual disturbance.  Respiratory: Negative for shortness of breath and wheezing.   Cardiovascular: Negative for chest pain and palpitations.  Gastrointestinal: Positive for abdominal pain and diarrhea. Negative for nausea and vomiting.       Dark stool  Genitourinary: Negative for dysuria and urgency.  Musculoskeletal: Negative for arthralgias and myalgias.  Skin: Negative for pallor and wound.  Neurological: Negative for dizziness and headaches.     Physical Exam Updated Vital Signs BP 126/61   Pulse 75   Temp 98.8 F (37.1 C) (Oral)   Resp 16   Ht 5\' 4"  (1.626 m)   Wt 65.3 kg (144 lb)   SpO2 95%   BMI 24.72 kg/m   Physical Exam  Constitutional: She is oriented to person, place, and time. She appears well-developed and well-nourished. No distress.    HENT:  Head: Normocephalic and atraumatic.  Eyes: EOM are normal. Pupils are equal, round, and reactive to light.  Neck: Normal range of motion. Neck supple.  Cardiovascular: Normal rate and regular rhythm.  Exam reveals no gallop and no friction rub.   No murmur heard. Pulmonary/Chest: Effort normal. She has no wheezes. She has no rales.  Abdominal: Soft. She exhibits no distension and no mass. There is tenderness (ruq). There is no guarding.  Musculoskeletal: She exhibits no edema or tenderness.  Neurological: She is alert and oriented to person, place, and time.  Skin: Skin is warm and dry. She is not diaphoretic.  Psychiatric: She has a normal mood and affect. Her behavior is normal.  Nursing [name]  and vitals reviewed.    ED Treatments / Results  Labs (all labs ordered are listed, but only abnormal results are displayed) Labs Reviewed  COMPREHENSIVE METABOLIC PANEL - Abnormal; Notable for the following:       Result Value   Glucose, Bld 148 (*)    All other components within normal limits  CBC - Abnormal; Notable for the following:    WBC 11.6 (*)    All other components within normal limits  URINALYSIS, ROUTINE W REFLEX MICROSCOPIC - Abnormal; Notable for the following:    Specific Gravity, Urine 1.039 (*)    Hgb urine dipstick SMALL (*)    Bacteria, UA RARE (*)    Squamous Epithelial / LPF 0-5 (*)    All other components within normal limits  LIPASE, BLOOD    EKG  EKG Interpretation None       Radiology Ct Abdomen Pelvis W Contrast  Result Date: 10/21/2016 CLINICAL DATA:  Right-sided abdominal pain.  Diarrhea. EXAM: CT ABDOMEN AND PELVIS WITH CONTRAST TECHNIQUE: Multidetector CT imaging of the abdomen and pelvis was performed using the standard protocol following bolus administration of intravenous contrast. CONTRAST:  120mL ISOVUE-300 IOPAMIDOL (ISOVUE-300) INJECTION 61% COMPARISON:  None. FINDINGS: Lower chest: No acute abnormality. Hepatobiliary: No focal liver  abnormality is seen. The gallbladder is collapsed or surgically absent. No biliary dilatation. Pancreas: Unremarkable. No pancreatic ductal dilatation or surrounding inflammatory changes. Spleen: Normal in size without focal abnormality. Adrenals/Urinary Tract: Adrenal glands are unremarkable. Kidneys are normal, without renal calculi, focal lesion, or hydronephrosis. Bladder is unremarkable. Stomach/Bowel: No evidence of small-bowel obstruction. The stomach is normal. The appendix is normal. Diffuse mucosal thickening of the ascending, transverse and parts of the descending colon, likely infectious or inflammatory. Vascular/Lymphatic: Aortic atherosclerosis. No enlarged abdominal or pelvic lymph nodes. Reproductive: Status post hysterectomy. No adnexal masses. Other: Mesenteric fat containing left periumbilical anterior abdominal wall hernia. Two tiny fat containing right periumbilical abdominal wall hernias. No abdominopelvic ascites. Musculoskeletal: No acute or significant osseous findings. IMPRESSION: Diffuse mucosal thickening of the colon, likely infectious or inflammatory. No evidence of abnormalities within the solid abdominal organs. Calcific atherosclerotic disease of the aorta. Electronically Signed   By: Fidela Salisbury M.D.   On: 10/21/2016 19:21    Procedures Procedures (including critical care time)  Medications Ordered in ED Medications  oxyCODONE-acetaminophen (PERCOCET/ROXICET) 5-325 MG per tablet 1 tablet (not administered)  morphine 4 MG/ML injection 4 mg (4 mg Intravenous Given 10/21/16 1803)  ondansetron (ZOFRAN) injection 4 mg (4 mg Intravenous Given 10/21/16 1802)  sodium chloride 0.9 % bolus 1,000 mL (0 mLs Intravenous Stopped 10/21/16 1847)  iopamidol (ISOVUE-300) 61 % injection (100 mLs Intravenous Contrast Given 10/21/16 1854)     Initial Impression / Assessment and Plan / ED Course  I have reviewed the triage vital signs and the nursing notes.  Pertinent labs &  imaging results that were available during my care of the patient were reviewed by me and considered in my medical decision making (see chart for details).     61 yo F With a chief complaint of abdominal pain. CT scan with likely colitis. Will start on Cipro Flagyl. Feeling better after IV fluids pain medicine and nausea medicine. PCP follow-up.  8:23 PM:  I have discussed the diagnosis/risks/treatment options with the patient and family and believe the pt to be eligible for discharge home to follow-up with PCP. We also discussed returning to the ED immediately if new or worsening sx occur. We discussed  the sx which are most concerning (e.g., sudden worsening pain, fever, inability to tolerate by mouth) that necessitate immediate return. Medications administered to the patient during their visit and any new prescriptions provided to the patient are listed below.  Medications given during this visit Medications  oxyCODONE-acetaminophen (PERCOCET/ROXICET) 5-325 MG per tablet 1 tablet (not administered)  morphine 4 MG/ML injection 4 mg (4 mg Intravenous Given 10/21/16 1803)  ondansetron (ZOFRAN) injection 4 mg (4 mg Intravenous Given 10/21/16 1802)  sodium chloride 0.9 % bolus 1,000 mL (0 mLs Intravenous Stopped 10/21/16 1847)  iopamidol (ISOVUE-300) 61 % injection (100 mLs Intravenous Contrast Given 10/21/16 1854)     The patient appears reasonably screen and/or stabilized for discharge and I doubt any other medical condition or other [facility] requiring further screening, evaluation, or treatment in the ED at this time prior to discharge.    Final Clinical Impressions(s) / ED Diagnoses   Final diagnoses:  Colitis    New Prescriptions New Prescriptions   CIPROFLOXACIN (CIPRO) 500 MG TABLET    Take 1 tablet (500 mg total) by mouth 2 (two) times daily.   METRONIDAZOLE (FLAGYL) 500 MG TABLET    Take 1 tablet (500 mg total) by mouth 2 (two) times daily.   MORPHINE (MSIR) 15 MG TABLET    Take 1 tablet  (15 mg total) by mouth every 4 (four) hours as needed for severe pain.   ONDANSETRON (ZOFRAN ODT) 4 MG DISINTEGRATING TABLET    Take 1 tablet (4 mg total) by mouth every 8 (eight) hours as needed for nausea or vomiting.     Deno Etienne, DO 10/21/16 2023

## 2016-10-21 NOTE — ED Triage Notes (Signed)
Pt states lower abdominal pain began 3 days ago. Denies n/v, endorses some diarrhea. Denies urinary symptoms.

## 2016-10-21 NOTE — ED Notes (Signed)
ED Provider at bedside. 

## 2016-10-21 NOTE — Discharge Instructions (Signed)
Return for sudden worsening pain, fever, inability to eat or drink.   Take tylenol 1000mg (2 extra strength) four times a day.

## 2016-10-23 ENCOUNTER — Other Ambulatory Visit: Payer: Self-pay | Admitting: Family Medicine

## 2016-10-23 DIAGNOSIS — Z1231 Encounter for screening mammogram for malignant neoplasm of breast: Secondary | ICD-10-CM

## 2016-10-26 NOTE — Progress Notes (Signed)
   Subjective:   Patient ID: Sabrina [name]    DOB: October 17, 1955, 61 y.o. female   MRN: [id number]  CC: "Hospital follow-up for colitis"  HPI: Sabrina [name] is a 61 y.o. female who presents to clinic today acute colitis hospital follow-up. Problems discussed today are as follows:  Acute colitis: Patient seen 6/30 for abdominal pain found to have colitis by CT. Patient discharged with Cipro and Flagyl. Flagyl is nearly completed, however patient did not start ciprofloxacin because she thought she should only take it if the Flagyl did not work. Abdominal pain is greatly improved stools have been more well formed. Patient states she had a colonoscopy in 2012 with one benign polyp does not follow-up since. ROS: Denies fevers or chills, nausea or vomiting, diarrhea or constipation, hematochezia or melena.  Complete ROS performed, see HPI for pertinent.  [place]: HTN, GERD, chronic bronchitis, tobacco use diorder, obesity. H/o cholecystectomy, abdominal hysterectomy. Father h/o emphysema, mother h/o alzheimer, HLD, HTN, sister EtOH abuse, brother asthma. Smoking status reviewed. Medications reviewed.  Objective:   BP 130/72   Pulse 70   Temp 98.3 F (36.8 C) (Oral)   Wt 138 lb (62.6 kg)   BMI 23.69 kg/m  Vitals and nursing note reviewed.  General: obese, well nourished, well developed, in no acute distress with non-toxic appearance HEENT: normocephalic, atraumatic, moist mucous membranes CV: regular rate and rhythm without murmurs, rubs, or gallops, no lower extremity edema Lungs: clear to auscultation bilaterally with normal work of breathing Abdomen: soft, minimally tender in lower left quadrant, non-distended, no masses or organomegaly palpable, normoactive bowel sounds Skin: warm, dry, no rashes or lesions, cap refill < 2 seconds Extremities: warm and well perfused, normal tone  Assessment & Plan:   Acute colitis Acute. Improved. Patient currently completing course of antibiotics.  Stools are improved and patient tolerating diet well. --Complete course of Flagyl and Cipro --Given information for repeat screening colonoscopy due to previous in 2012 with one 4 mm benign polyp with recommendations for 5 year follow-up, and would benefit from recent colitis episode  No orders of the defined types were placed in this encounter.  No orders of the defined types were placed in this encounter.   Harriet Butte, [place], PGY-2 10/27/2016 2:57 PM

## 2016-10-27 ENCOUNTER — Ambulatory Visit (INDEPENDENT_AMBULATORY_CARE_PROVIDER_SITE_OTHER): Payer: BLUE CROSS/BLUE SHIELD | Admitting: Family Medicine

## 2016-10-27 ENCOUNTER — Encounter: Payer: Self-pay | Admitting: Family Medicine

## 2016-10-27 DIAGNOSIS — K529 Noninfective gastroenteritis and colitis, unspecified: Secondary | ICD-10-CM | POA: Diagnosis not present

## 2016-10-27 NOTE — Assessment & Plan Note (Signed)
Acute. Improved. Patient currently completing course of antibiotics. Stools are improved and patient tolerating diet well. --Complete course of Flagyl and Cipro --Given information for repeat screening colonoscopy due to previous in 2012 with one 4 mm benign polyp with recommendations for 5 year follow-up, and would benefit from recent colitis episode

## 2016-10-27 NOTE — Patient Instructions (Addendum)
Thank you for coming in to see [place] today. Please see below to review our plan for today's visit.  1. It is very important to complete the antibiotic course that was given to you. Continue taking the Flagyl as prescribed and start the ciprofloxacin today. In addition, make sure to avoid eating excessive amounts of red meats increase his foods. Below live given information on how to incorporate fiber to your diet. You can also try Metamucil over-the-counter. His medication will bulk your stool and prevent irritation to the colon. 2. I have given you information on local gastroenterologist in the area who can perform a screening colonoscopy. Please call and schedule an appointment.  Return to clinic in 6 months or sooner if needed.  Please call the clinic at [phone] if your symptoms worsen or you have any concerns. It was my pleasure to see you. -- Harriet Butte, [place], PGY-2   High-Fiber Diet Fiber, also called dietary fiber, is a type of carbohydrate found in fruits, vegetables, whole grains, and beans. A high-fiber diet can have many health benefits. Your health care provider may recommend a high-fiber diet to help:  Prevent constipation. Fiber can make your bowel movements more regular.  Lower your cholesterol.  Relieve hemorrhoids, uncomplicated diverticulosis, or irritable bowel syndrome.  Prevent overeating as part of a weight-loss plan.  Prevent heart disease, type 2 diabetes, and certain cancers.  What is my plan? The recommended daily intake of fiber includes:  38 grams for men under age 40.  65 grams for men over age 82.  38 grams for women under age 56.  78 grams for women over age 22.  You can get the recommended daily intake of dietary fiber by eating a variety of fruits, vegetables, grains, and beans. Your health care provider may also recommend a fiber supplement if it is not possible to get enough fiber through your diet. What do I  need to know about a high-fiber diet?  Fiber supplements have not been widely studied for their effectiveness, so it is better to get fiber through food sources.  Always check the fiber content on thenutrition facts label of any prepackaged food. Look for foods that contain at least 5 grams of fiber per serving.  Ask your dietitian if you have questions about specific foods that are related to your condition, especially if those foods are not listed in the following section.  Increase your daily fiber consumption gradually. Increasing your intake of dietary fiber too quickly may cause bloating, cramping, or gas.  Drink plenty of water. Water helps you to digest fiber. What foods can I eat? Grains Whole-grain breads. Multigrain cereal. Oats and oatmeal. Brown rice. Barley. Bulgur wheat. [place]. Bran muffins. Popcorn. Rye wafer crackers. Vegetables Sweet potatoes. Spinach. Kale. Artichokes. Cabbage. Broccoli. Green peas. Carrots. Squash. Fruits Berries. Pears. Apples. Oranges. Avocados. Prunes and raisins. Dried figs. Meats and Other Protein Sources [place], kidney, pinto, and soy beans. Split peas. Lentils. Nuts and seeds. Dairy Fiber-fortified yogurt. Beverages Fiber-fortified soy milk. Fiber-fortified orange juice. Other Fiber bars. The items listed above may not be a complete list of recommended foods or beverages. Contact your dietitian for more options. What foods are not recommended? Grains White bread. Pasta made with refined flour. White rice. Vegetables Fried potatoes. Canned vegetables. Well-cooked vegetables. Fruits Fruit juice. Cooked, strained fruit. Meats and Other Protein Sources Fatty cuts of meat. Fried Sales executive or fried fish. Dairy Milk. Yogurt. Cream cheese. Sour cream. Beverages Soft drinks.  Other Cakes and pastries. Butter and oils. The items listed above may not be a complete list of foods and beverages to avoid. Contact your dietitian for more  information. What are some tips for including high-fiber foods in my diet?  Eat a wide variety of high-fiber foods.  Make sure that half of all grains consumed each day are whole grains.  Replace breads and cereals made from refined flour or white flour with whole-grain breads and cereals.  Replace white rice with brown rice, bulgur wheat, or millet.  Start the day with a breakfast that is high in fiber, such as a cereal that contains at least 5 grams of fiber per serving.  Use beans in place of meat in soups, salads, or pasta.  Eat high-fiber snacks, such as berries, raw vegetables, nuts, or popcorn. This information is not intended to replace advice given to you by your health care provider. Make sure you discuss any questions you have with your health care provider. Document Released: 04/10/2005 Document Revised: 09/16/2015 Document Reviewed: 09/23/2013 Elsevier Interactive Patient Education  2017 [employer].

## 2016-10-30 ENCOUNTER — Telehealth: Payer: Self-pay | Admitting: Family Medicine

## 2016-10-30 NOTE — Telephone Encounter (Signed)
Needs referral for colonoscopy.  She had one 5 yrs in [place] when she had no insurance.  She has called [facility] and [facility] and each one has requested a referral. Please advise

## 2016-11-01 ENCOUNTER — Other Ambulatory Visit: Payer: Self-pay | Admitting: Family Medicine

## 2016-11-01 DIAGNOSIS — Z1211 Encounter for screening for malignant neoplasm of colon: Secondary | ICD-10-CM

## 2016-11-20 ENCOUNTER — Ambulatory Visit
Admission: RE | Admit: 2016-11-20 | Discharge: 2016-11-20 | Disposition: A | Discharge status: Home / Self Care | Payer: BLUE CROSS/BLUE SHIELD | Source: Ambulatory Visit | Attending: Family Medicine | Admitting: Family Medicine

## 2016-11-20 DIAGNOSIS — Z1231 Encounter for screening mammogram for malignant neoplasm of breast: Secondary | ICD-10-CM

## 2016-12-04 ENCOUNTER — Telehealth: Payer: Self-pay | Admitting: Gastroenterology

## 2016-12-04 ENCOUNTER — Telehealth: Payer: Self-pay | Admitting: Family Medicine

## 2016-12-04 NOTE — Telephone Encounter (Signed)
Patient came by office inquiring about referral. Patient would like a call back in regards to this. Best # work phone [phone]

## 2016-12-04 NOTE — Telephone Encounter (Signed)
Tried calling number provided, they state she was not there today. Also called home #, no answer no voicemail. I just faxed over last colonoscopy from [facility] to [place], they will review and contact patient.

## 2016-12-04 NOTE — Telephone Encounter (Signed)
Received last colon report from [facility] and placed on Dr. Lynne Leader (DOD) desk for review

## 2017-01-04 NOTE — Telephone Encounter (Signed)
Per Dr. Fuller Plan, our office needs path report. We have left a message on 12/07/16 and 01/04/17 for patient to return our call. Records will be in "records reviewed" folder

## 2017-01-29 NOTE — Telephone Encounter (Signed)
Received pathology report and placed on Dr.Stark's desk for review.

## 2017-01-30 NOTE — Telephone Encounter (Signed)
Dr Fuller Plan reviewed previous Pathology & Colonoscopy from [facility]. She is not due for another procedure for 10 years from last this date. I called and spoke to Ms Gratz. Advised her I will place a reminder in computer to reach out to her in August 2023.

## 2017-01-31 ENCOUNTER — Encounter: Payer: Self-pay | Admitting: Family Medicine

## 2017-05-21 ENCOUNTER — Other Ambulatory Visit: Payer: Self-pay

## 2017-05-21 DIAGNOSIS — I10 Essential (primary) hypertension: Secondary | ICD-10-CM

## 2017-05-21 MED ORDER — HYDROCHLOROTHIAZIDE 25 MG PO TABS: ORAL_TABLET | ORAL | 2 refills | Status: DC

## 2017-05-21 MED ORDER — LISINOPRIL 5 MG PO TABS: 5.0000 mg | ORAL_TABLET | Freq: Every day | ORAL | 2 refills | Status: DC

## 2017-05-21 MED ORDER — PRAVASTATIN SODIUM 40 MG PO TABS
40.0000 mg | ORAL_TABLET | Freq: Every day | ORAL | 3 refills | Status: DC
Start: 2017-05-21 — End: 2018-05-20

## 2017-08-30 ENCOUNTER — Ambulatory Visit (INDEPENDENT_AMBULATORY_CARE_PROVIDER_SITE_OTHER): Payer: BLUE CROSS/BLUE SHIELD | Admitting: Family Medicine

## 2017-08-30 ENCOUNTER — Encounter: Payer: Self-pay | Admitting: Family Medicine

## 2017-08-30 VITALS — BP 105/80 | HR 72 | Temp 98.6°F | Ht 64.0 in | Wt 138.8 lb

## 2017-08-30 DIAGNOSIS — K529 Noninfective gastroenteritis and colitis, unspecified: Secondary | ICD-10-CM | POA: Diagnosis not present

## 2017-08-30 DIAGNOSIS — J42 Unspecified chronic bronchitis: Secondary | ICD-10-CM

## 2017-08-30 MED ORDER — BENZONATATE 100 MG PO CAPS: 100.0000 mg | ORAL_CAPSULE | Freq: Two times a day (BID) | ORAL | 0 refills | Status: DC | PRN

## 2017-08-30 NOTE — Patient Instructions (Addendum)
It was great to meet you today! Thank you for letting me participate in your care!  Today, we discussed your abdominal pain. It may due to acute colitis or gastroenteritis. I will be getting some blood work today to help determine what the cause most likely is. If your symptoms are not better by Monday, please return to the clinic and we will start you on antibiotics and may need to get some imaging.  You can take over the counter imodium to help with the diarrhea.    Be well, Harolyn Rutherford, DO PGY-1, [facility]

## 2017-08-30 NOTE — Assessment & Plan Note (Addendum)
Possible etiologies include recurrent infectious colitis, inflammatory colitis, or gastroenteritis.  Will obtain CBC, CMP, ESR, and CRP today to help determine etiology. If high WBC and labs suggestive of infection will start Cipro and Flagyl. If elevated CRP and ESR I will refer to Gastroenterology.  Instructed patient to use Imodium for diarrhea, and if she is not improved by Monday to return to clinic. If labs are normal but not improved consider imaging; abdominal CT scan.  Of note, patient has had colonoscopy within the past 3 years and showed only one polyp but no signs of inflammatory disease.

## 2017-08-30 NOTE — Progress Notes (Signed)
Subjective: Chief Complaint  Patient presents with  . Abdominal Pain     HPI: TALESHA ELLITHORPE is a 62 y.o. presenting to clinic today to discuss the following:  Acute Abdominal Pain Patient states she has been having abdominal pain for about 4 days and describes it as "burning all over" and indicates her abdomen area. She states the pain gets up to a 10/10, is intermittent, and the episodes last about 1 hour and multiple episodes of pain occurring every day. She has been diagnosed with acute colitis in the past (approximately 13 months ago) and was prescribed Ciprofloxacin and Flagyl and her symptoms resolved. She states her current pain feels like the pain she had when she was treated for acute colitis.  She has not tried taking anything for the abdominal pain except Tylenol which provides little to no relief. She is a chronic smoker and is not ready to try and quit today.  She has associated fatigue, night sweats, nausea, poor sleep, cough with productive yellowish clear sputum, and SOB. She is also having associated diarrhea since Monday.  She denies fever, chills, vision changes, vomiting, joint pain, blood in the urine or stool   Health Maintenance: none     ROS noted in HPI.   Past Medical, Surgical, Social, and Family History Reviewed & Updated per EMR.   Pertinent Historical Findings include:   Social History   Tobacco Use  Smoking Status Current Every Day Smoker  . Packs/day: 1.00  . Types: Cigarettes  Smokeless Tobacco Never Used  Tobacco Comment   decreased smoking      Objective: BP 105/80 (BP Location: Left Arm, Patient Position: Sitting, Cuff Size: Normal)   Pulse 72   Temp 98.6 F (37 C) (Oral)   Ht '5\' 4"'  (1.626 m)   Wt 138 lb 12.8 oz (63 kg)   SpO2 96%   BMI 23.82 kg/m  Vitals and nursing notes reviewed  Physical Exam  Constitutional: She is oriented to person, place, and time. She appears well-developed and well-nourished. She does not  appear ill. No distress.  HENT:  Head: Normocephalic and atraumatic.  Eyes: Pupils are equal, round, and reactive to light. EOM are normal. No scleral icterus.  Cardiovascular: Normal rate, regular rhythm and intact distal pulses.  Murmur heard. 3/6 systolic murmur  Pulmonary/Chest: Effort normal and breath sounds normal. She has no wheezes. She has no rhonchi. She has no rales.  Abdominal: Soft. Normal appearance and bowel sounds are normal. She exhibits no shifting dullness, no fluid wave, no ascites and no mass. There is no hepatomegaly. There is generalized tenderness and tenderness in the right upper quadrant and right lower quadrant. There is no rigidity, no rebound, no guarding and negative Murphy's sign.  Neurological: She is alert and oriented to person, place, and time.  Skin: Skin is warm and dry. Capillary refill takes less than 2 seconds. No rash noted. No erythema.   No results found for this or any previous visit (from the past 72 hour(s)).  Assessment/Plan:  Acute colitis Possible etiologies include recurrent infectious colitis, inflammatory colitis, or gastroenteritis.  Will obtain CBC, CMP, ESR, and CRP today to help determine etiology. If high WBC and labs suggestive of infection will start Cipro and Flagyl. If elevated CRP and ESR I will refer to Gastroenterology.  Instructed patient to use Imodium for diarrhea, and if she is not improved by Monday to return to clinic. If labs are normal but not improved consider imaging;  abdominal CT scan.  Of note, patient has had colonoscopy within the past 3 years and showed only one polyp but no signs of inflammatory disease.  Chronic bronchitis Wrote for [facility] for cough. Otherwise, stable. Cont current medications.  PATIENT EDUCATION PROVIDED: See AVS    Diagnosis and plan along with any newly prescribed medication(s) were discussed in detail with this patient today. The patient verbalized understanding and agreed with the  plan. Patient advised if symptoms worsen return to clinic or ER.   Health Maintainance:   Orders Placed This Encounter  Procedures  . Lipase  . CBC with Differential  . Comprehensive metabolic panel  . Sedimentation Rate  . C-reactive protein    Meds ordered this encounter  Medications  . benzonatate (TESSALON) 100 MG capsule    Sig: Take 1 capsule (100 mg total) by mouth 2 (two) times daily as needed for cough.    Dispense:  20 capsule    Refill:  0     Harolyn Rutherford, DO 08/30/2017, 10:26 AM PGY-1, [place]

## 2017-08-30 NOTE — Assessment & Plan Note (Signed)
Wrote for [employer] for cough. Otherwise, stable. Cont current medications.

## 2017-08-31 LAB — COMPREHENSIVE METABOLIC PANEL
ALT: 27 IU/L (ref 0–32)
AST: 18 IU/L (ref 0–40)
Albumin/Globulin Ratio: 1.4 (ref 1.2–2.2)
Albumin: 4.2 g/dL (ref 3.6–4.8)
Alkaline Phosphatase: 126 IU/L — ABNORMAL HIGH (ref 39–117)
BUN/Creatinine Ratio: 13 (ref 12–28)
BUN: 8 mg/dL (ref 8–27)
Bilirubin Total: 0.4 mg/dL (ref 0.0–1.2)
CO2: 26 mmol/L (ref 20–29)
Calcium: 10.4 mg/dL — ABNORMAL HIGH (ref 8.7–10.3)
Chloride: 103 mmol/L (ref 96–106)
Creatinine, Ser: 0.63 mg/dL (ref 0.57–1.00)
GFR calc Af Amer: 113 mL/min/{1.73_m2} (ref >59)
GFR calc non Af Amer: 98 mL/min/{1.73_m2} (ref >59)
Globulin, Total: 3.1 g/dL (ref 1.5–4.5)
Glucose: 89 mg/dL (ref 65–99)
Potassium: 4.2 mmol/L (ref 3.5–5.2)
Sodium: 139 mmol/L (ref 134–144)
Total Protein: 7.3 g/dL (ref 6.0–8.5)

## 2017-08-31 LAB — CBC WITH DIFFERENTIAL/PLATELET
Basophils Absolute: 0 10*3/uL (ref 0.0–0.2)
Basos: 0 %
EOS (ABSOLUTE): 0.2 10*3/uL (ref 0.0–0.4)
Eos: 2 %
Hematocrit: 42.1 % (ref 34.0–46.6)
Hemoglobin: 14.2 g/dL (ref 11.1–15.9)
Immature Grans (Abs): 0 10*3/uL (ref 0.0–0.1)
Immature Granulocytes: 0 %
Lymphocytes Absolute: 2.5 10*3/uL (ref 0.7–3.1)
Lymphs: 31 %
MCH: 31.8 pg (ref 26.6–33.0)
MCHC: 33.7 g/dL (ref 31.5–35.7)
MCV: 94 fL (ref 79–97)
Monocytes Absolute: 0.7 10*3/uL (ref 0.1–0.9)
Monocytes: 8 %
Neutrophils Absolute: 4.8 10*3/uL (ref 1.4–7.0)
Neutrophils: 59 %
Platelets: 299 10*3/uL (ref 150–379)
RBC: 4.46 x10E6/uL (ref 3.77–5.28)
RDW: 13.3 % (ref 12.3–15.4)
WBC: 8.2 10*3/uL (ref 3.4–10.8)

## 2017-08-31 LAB — C-REACTIVE PROTEIN: CRP: 2.1 mg/L (ref 0.0–4.9)

## 2017-08-31 LAB — SEDIMENTATION RATE: Sed Rate: 7 mm/hr (ref 0–40)

## 2017-08-31 LAB — LIPASE: Lipase: 28 U/L (ref 14–72)

## 2017-09-04 ENCOUNTER — Encounter: Payer: Self-pay | Admitting: Family Medicine

## 2017-09-04 NOTE — Progress Notes (Signed)
Lab results were normal. Forwarding letter to team to mail to patient.

## 2017-12-05 ENCOUNTER — Other Ambulatory Visit: Payer: Self-pay | Admitting: Family Medicine

## 2017-12-05 DIAGNOSIS — Z1231 Encounter for screening mammogram for malignant neoplasm of breast: Secondary | ICD-10-CM

## 2017-12-31 ENCOUNTER — Ambulatory Visit
Admission: RE | Admit: 2017-12-31 | Discharge: 2017-12-31 | Disposition: A | Discharge status: Home / Self Care | Payer: BLUE CROSS/BLUE SHIELD | Source: Ambulatory Visit | Attending: Family Medicine | Admitting: Family Medicine

## 2017-12-31 DIAGNOSIS — Z1231 Encounter for screening mammogram for malignant neoplasm of breast: Secondary | ICD-10-CM

## 2018-02-18 ENCOUNTER — Other Ambulatory Visit: Payer: Self-pay | Admitting: Family Medicine

## 2018-02-18 DIAGNOSIS — I10 Essential (primary) hypertension: Secondary | ICD-10-CM

## 2018-04-10 ENCOUNTER — Telehealth: Payer: Self-pay | Admitting: Family Medicine

## 2018-04-10 NOTE — Telephone Encounter (Signed)
Patient came into office stating she received a call telling her to come in at 10:30 today regarding medication.  Patient does not know who called or what medication it was regarding and there is no appointment scheduled.

## 2018-04-10 NOTE — Telephone Encounter (Signed)
I see no medication for patient and there are no notes of this supposed call to patient.  There has not been any RX's placed recently either.  Sabrina [name], [place]

## 2018-05-19 ENCOUNTER — Other Ambulatory Visit: Payer: Self-pay | Admitting: Family Medicine

## 2018-05-19 DIAGNOSIS — I10 Essential (primary) hypertension: Secondary | ICD-10-CM

## 2018-05-20 ENCOUNTER — Other Ambulatory Visit: Payer: Self-pay | Admitting: *Deleted

## 2018-05-20 MED ORDER — PRAVASTATIN SODIUM 40 MG PO TABS: 40.0000 mg | ORAL_TABLET | Freq: Every day | ORAL | 3 refills | Status: DC

## 2018-06-06 ENCOUNTER — Other Ambulatory Visit: Payer: Self-pay | Admitting: Family Medicine

## 2018-06-06 DIAGNOSIS — I10 Essential (primary) hypertension: Secondary | ICD-10-CM

## 2018-09-09 ENCOUNTER — Other Ambulatory Visit: Payer: Self-pay

## 2018-09-09 DIAGNOSIS — I10 Essential (primary) hypertension: Secondary | ICD-10-CM

## 2018-09-10 ENCOUNTER — Other Ambulatory Visit: Payer: Self-pay | Admitting: Family Medicine

## 2018-09-10 DIAGNOSIS — I10 Essential (primary) hypertension: Secondary | ICD-10-CM

## 2018-09-10 MED ORDER — HYDROCHLOROTHIAZIDE 25 MG PO TABS: 25.0000 mg | ORAL_TABLET | Freq: Every day | ORAL | 0 refills | Status: DC

## 2018-11-29 ENCOUNTER — Other Ambulatory Visit: Payer: Self-pay

## 2018-11-29 DIAGNOSIS — I10 Essential (primary) hypertension: Secondary | ICD-10-CM

## 2018-11-29 MED ORDER — LISINOPRIL 5 MG PO TABS: 5.0000 mg | ORAL_TABLET | Freq: Every day | ORAL | 0 refills | Status: DC

## 2019-02-27 ENCOUNTER — Other Ambulatory Visit: Payer: Self-pay | Admitting: Family Medicine

## 2019-02-27 DIAGNOSIS — I10 Essential (primary) hypertension: Secondary | ICD-10-CM

## 2019-03-31 ENCOUNTER — Other Ambulatory Visit: Payer: Self-pay

## 2019-03-31 DIAGNOSIS — I10 Essential (primary) hypertension: Secondary | ICD-10-CM

## 2019-04-01 ENCOUNTER — Other Ambulatory Visit: Payer: Self-pay | Admitting: *Deleted

## 2019-04-01 DIAGNOSIS — I10 Essential (primary) hypertension: Secondary | ICD-10-CM

## 2019-04-01 MED ORDER — HYDROCHLOROTHIAZIDE 25 MG PO TABS: 25.0000 mg | ORAL_TABLET | Freq: Every day | ORAL | 0 refills | Status: DC

## 2019-05-28 ENCOUNTER — Other Ambulatory Visit: Payer: Self-pay | Admitting: *Deleted

## 2019-05-28 DIAGNOSIS — I10 Essential (primary) hypertension: Secondary | ICD-10-CM

## 2019-05-28 MED ORDER — HYDROCHLOROTHIAZIDE 25 MG PO TABS: 25.0000 mg | ORAL_TABLET | Freq: Every day | ORAL | 0 refills | Status: DC

## 2019-07-10 ENCOUNTER — Other Ambulatory Visit: Payer: Self-pay | Admitting: *Deleted

## 2019-07-10 MED ORDER — PRAVASTATIN SODIUM 40 MG PO TABS: 40.0000 mg | ORAL_TABLET | Freq: Every day | ORAL | 3 refills | Status: DC

## 2019-08-01 ENCOUNTER — Other Ambulatory Visit: Payer: Self-pay | Admitting: Family Medicine

## 2019-08-01 DIAGNOSIS — Z1231 Encounter for screening mammogram for malignant neoplasm of breast: Secondary | ICD-10-CM

## 2019-08-05 ENCOUNTER — Encounter: Payer: Self-pay | Admitting: Family Medicine

## 2019-08-05 ENCOUNTER — Other Ambulatory Visit: Payer: Self-pay

## 2019-08-05 ENCOUNTER — Ambulatory Visit (INDEPENDENT_AMBULATORY_CARE_PROVIDER_SITE_OTHER): Payer: 59 | Admitting: Family Medicine

## 2019-08-05 VITALS — BP 122/72 | HR 78 | Ht 64.0 in | Wt 153.0 lb

## 2019-08-05 DIAGNOSIS — G4700 Insomnia, unspecified: Secondary | ICD-10-CM | POA: Diagnosis not present

## 2019-08-05 DIAGNOSIS — R195 Other fecal abnormalities: Secondary | ICD-10-CM | POA: Diagnosis not present

## 2019-08-05 DIAGNOSIS — Z86010 Personal history of colonic polyps: Secondary | ICD-10-CM

## 2019-08-05 DIAGNOSIS — I10 Essential (primary) hypertension: Secondary | ICD-10-CM

## 2019-08-05 DIAGNOSIS — K219 Gastro-esophageal reflux disease without esophagitis: Secondary | ICD-10-CM | POA: Diagnosis not present

## 2019-08-05 DIAGNOSIS — G5751 Tarsal tunnel syndrome, right lower limb: Secondary | ICD-10-CM

## 2019-08-05 DIAGNOSIS — R29898 Other symptoms and signs involving the musculoskeletal system: Secondary | ICD-10-CM

## 2019-08-05 MED ORDER — PANTOPRAZOLE SODIUM 40 MG PO TBEC: 40.0000 mg | DELAYED_RELEASE_TABLET | Freq: Every day | ORAL | 0 refills | Status: DC

## 2019-08-05 MED ORDER — BREO ELLIPTA 200-25 MCG/INH IN AEPB: 1.0000 | INHALATION_SPRAY | Freq: Every day | RESPIRATORY_TRACT | 0 refills | Status: DC

## 2019-08-05 MED ORDER — ALBUTEROL SULFATE HFA 108 (90 BASE) MCG/ACT IN AERS: 2.0000 | INHALATION_SPRAY | RESPIRATORY_TRACT | 0 refills | Status: DC | PRN

## 2019-08-05 NOTE — Patient Instructions (Addendum)
It was great seeing you today!  Believe your ankle pain is due to something called tarsal tunnel syndrome.  Give you some rehab stretches and exercises to help with this problem.  You can continue to take Tylenol or topical liniments for the pain.  Your arm and hand strength is due to muscle atrophy from not using them as much after he stopped working.  I gave you some rehab exercises to do to get this back up to snuff.  They can be accessed at the following link: [email or web address]  For your colonoscopy I would call your gastroenterologist and see if you need to have this done.  The report shows to be done in 5 years but I do not see an indication for this.  Number to call them is [phone]  For your heartburn I sent in a medication called Protonix.  Is a 40 mg tablet once daily.  Lastly for your sleep issues you can pick up some over-the-counter melatonin.  I gave you a handout for this.  They can be effective over-the-counter.

## 2019-08-06 LAB — COMPREHENSIVE METABOLIC PANEL
ALT: 13 IU/L (ref 0–32)
AST: 14 IU/L (ref 0–40)
Albumin/Globulin Ratio: 1.3 (ref 1.2–2.2)
Albumin: 4.3 g/dL (ref 3.8–4.8)
Alkaline Phosphatase: 130 IU/L — ABNORMAL HIGH (ref 39–117)
BUN/Creatinine Ratio: 15 (ref 12–28)
BUN: 11 mg/dL (ref 8–27)
Bilirubin Total: 0.3 mg/dL (ref 0.0–1.2)
CO2: 22 mmol/L (ref 20–29)
Calcium: 10.4 mg/dL — ABNORMAL HIGH (ref 8.7–10.3)
Chloride: 103 mmol/L (ref 96–106)
Creatinine, Ser: 0.74 mg/dL (ref 0.57–1.00)
GFR calc Af Amer: 100 mL/min/{1.73_m2} (ref >59)
GFR calc non Af Amer: 87 mL/min/{1.73_m2} (ref >59)
Globulin, Total: 3.2 g/dL (ref 1.5–4.5)
Glucose: 84 mg/dL (ref 65–99)
Potassium: 4.1 mmol/L (ref 3.5–5.2)
Sodium: 140 mmol/L (ref 134–144)
Total Protein: 7.5 g/dL (ref 6.0–8.5)

## 2019-08-06 LAB — CBC WITH DIFFERENTIAL/PLATELET
Basophils Absolute: 0 10*3/uL (ref 0.0–0.2)
Basos: 0 %
EOS (ABSOLUTE): 0.2 10*3/uL (ref 0.0–0.4)
Eos: 2 %
Hematocrit: 41.8 % (ref 34.0–46.6)
Hemoglobin: 14.8 g/dL (ref 11.1–15.9)
Immature Grans (Abs): 0 10*3/uL (ref 0.0–0.1)
Immature Granulocytes: 0 %
Lymphocytes Absolute: 2.3 10*3/uL (ref 0.7–3.1)
Lymphs: 29 %
MCH: 32.2 pg (ref 26.6–33.0)
MCHC: 35.4 g/dL (ref 31.5–35.7)
MCV: 91 fL (ref 79–97)
Monocytes Absolute: 0.6 10*3/uL (ref 0.1–0.9)
Monocytes: 7 %
Neutrophils Absolute: 4.9 10*3/uL (ref 1.4–7.0)
Neutrophils: 62 %
Platelets: 264 10*3/uL (ref 150–450)
RBC: 4.6 x10E6/uL (ref 3.77–5.28)
RDW: 13.2 % (ref 11.7–15.4)
WBC: 8 10*3/uL (ref 3.4–10.8)

## 2019-08-07 ENCOUNTER — Encounter: Payer: Self-pay | Admitting: Family Medicine

## 2019-08-07 DIAGNOSIS — G5750 Tarsal tunnel syndrome, unspecified lower limb: Secondary | ICD-10-CM | POA: Insufficient documentation

## 2019-08-07 DIAGNOSIS — G4700 Insomnia, unspecified: Secondary | ICD-10-CM | POA: Insufficient documentation

## 2019-08-07 DIAGNOSIS — R29898 Other symptoms and signs involving the musculoskeletal system: Secondary | ICD-10-CM | POA: Insufficient documentation

## 2019-08-07 NOTE — Assessment & Plan Note (Signed)
Patient with subjective complaints of hand weakness bilaterally.  Likely due to muscle atrophy from her not being as active as she was when she was working.  She still has bilateral 5 out of 5 strength and no focal neurologic deficits or weakness objectively on exam.  I gave her some upper body strengthening exercises to help

## 2019-08-07 NOTE — Progress Notes (Signed)
CHIEF COMPLAINT / HPI: 64 year old female who presents for right medial ankle pain, decreased strength in her hands and arms, heartburn, sleep issues, and checkup.  For the patient's ankle pain she describes the pain as a intermittent aching pain in her medial ankle just below her medial malleolus.  She says the pain is worse when she walks and is active.  She is taken Tylenol for the pain which has helped.  Patient is been trying to be more active recently.  Patient states that since she retired and has stopped working she has noticed some lack of strength in her hands or arms.  She believe that these might be due to does not using these muscles as much.  She states that opening jars and stuff like that is very difficult for her.  She states that she has been having some GERD-like symptoms.  The pain is intermittent burning sensation in the back of her throat after she eats greasy, fatty, or fried foods.  Is unsure of any triggers.  Patient is asking if she is due for a colonoscopy.  Last was done about 8 years ago.  They found 1 polyp of unknown pathology.  Could not locate this in care everywhere.  The report did say to get repeat in 5 years which she is every 4.  I gave her the number to contact [facility] to see if this is appropriate for her.   Patient states that she has been having trouble sleeping at night.  She appears to have fairly decent sleep hygiene, she can improve on relaxing for an hour and a half before he goes to sleep.  Does not drink any caffeinated beverages, alcohol, take naps during the day, or other issues.  She does sleep about 6 hours per night.   PERTINENT  PMH / PSH:   OBJECTIVE: BP 122/72   Pulse 78   Ht 5\' 4"  (1.626 m)   Wt 153 lb (69.4 kg)   SpO2 99%   BMI 26.26 kg/m   Gen: 64 year old African-American female, no acute distress, very pleasant CV: Regular rate and rhythm, 2/6 systolic ejection murmur on exam, unchanged from previous Resp: Lungs clear  to auscultation bilaterally Neuro: Alert and oriented, Speech clear, No gross deficits  Bilateral arms: Symmetric 5/5 strength bilateral hands.  Symmetric strength 5 out of 5 to bicep/tricep flexion.  No shoulder weakness noted. Right foot: Tenderness to palpation inferior and posterior to medial malleolus.  No limitation in any direction to range of motion with strength.-   ASSESSMENT / PLAN:  Insomnia While patient is having some trouble falling asleep she is getting about 6 hours sleep per night.  I did discuss that decreased sleep duration and ability to easily go to sleep does decrease with age.  I recommended trying some melatonin Gummies to see if this would help.  If there is concern down the road could potentially consider a sleep study or dive further into her sleep hygiene.  Hypertension Well-controlled at 122/72.  She is taking lisinopril 5 mg daily.  Get CBC and CMP as this is not been checked in a long time.  Acid reflux Symptoms consistent with acid reflux.  We will try Protonix 40 mg daily.  Can follow-up in 4 weeks or so and see if this is helpful.  If continues to be problematic could consider H. pylori testing or possible EGD with her colonoscopy at next colonoscopy to evaluate for hiatal hernia.  History of colon polyps  Patient with history of one colon polyp removed around 8 years ago.  Colonoscopy report recommended getting a repeat in 5 years.  I encouraged patient to call her gastroenterologist to see if this is appropriate at this time.  Tarsal tunnel syndrome Patient with likely tarsal tunnel syndrome given the distribution of her pain in the location.  Gave patient rehab exercises to do to help with this problem.  Can continue using Tylenol, topical liniments for the pain.  I did discuss the role of ice and heat in her care.  Could consider referring her for orthotics if the pain continues.  Hand weakness Patient with subjective complaints of hand weakness  bilaterally.  Likely due to muscle atrophy from her not being as active as she was when she was working.  She still has bilateral 5 out of 5 strength and no focal neurologic deficits or weakness objectively on exam.  I gave her some upper body strengthening exercises to help   Guadalupe Dawn MD PGY-3 Family Medicine Resident [place]

## 2019-08-07 NOTE — Assessment & Plan Note (Signed)
Patient with history of one colon polyp removed around 8 years ago.  Colonoscopy report recommended getting a repeat in 5 years.  I encouraged patient to call her gastroenterologist to see if this is appropriate at this time.

## 2019-08-07 NOTE — Assessment & Plan Note (Signed)
Symptoms consistent with acid reflux.  We will try Protonix 40 mg daily.  Can follow-up in 4 weeks or so and see if this is helpful.  If continues to be problematic could consider H. pylori testing or possible EGD with her colonoscopy at next colonoscopy to evaluate for hiatal hernia.

## 2019-08-07 NOTE — Assessment & Plan Note (Addendum)
Well-controlled at 122/72.  She is taking lisinopril 5 mg daily.  Get CBC and CMP as this is not been checked in a long time.

## 2019-08-07 NOTE — Assessment & Plan Note (Signed)
While patient is having some trouble falling asleep she is getting about 6 hours sleep per night.  I did discuss that decreased sleep duration and ability to easily go to sleep does decrease with age.  I recommended trying some melatonin Gummies to see if this would help.  If there is concern down the road could potentially consider a sleep study or dive further into her sleep hygiene.

## 2019-08-07 NOTE — Assessment & Plan Note (Signed)
Patient with likely tarsal tunnel syndrome given the distribution of her pain in the location.  Gave patient rehab exercises to do to help with this problem.  Can continue using Tylenol, topical liniments for the pain.  I did discuss the role of ice and heat in her care.  Could consider referring her for orthotics if the pain continues.

## 2019-08-14 ENCOUNTER — Other Ambulatory Visit: Payer: Self-pay

## 2019-08-14 ENCOUNTER — Ambulatory Visit
Admission: RE | Admit: 2019-08-14 | Discharge: 2019-08-14 | Disposition: A | Discharge status: Home / Self Care | Payer: 59 | Source: Ambulatory Visit | Attending: *Deleted | Admitting: *Deleted

## 2019-08-14 DIAGNOSIS — Z1231 Encounter for screening mammogram for malignant neoplasm of breast: Secondary | ICD-10-CM

## 2019-08-26 ENCOUNTER — Ambulatory Visit (INDEPENDENT_AMBULATORY_CARE_PROVIDER_SITE_OTHER): Payer: 59 | Admitting: Ophthalmology

## 2019-08-26 ENCOUNTER — Encounter (INDEPENDENT_AMBULATORY_CARE_PROVIDER_SITE_OTHER): Payer: Self-pay | Admitting: Ophthalmology

## 2019-08-26 DIAGNOSIS — H3581 Retinal edema: Secondary | ICD-10-CM | POA: Diagnosis not present

## 2019-08-26 DIAGNOSIS — H35033 Hypertensive retinopathy, bilateral: Secondary | ICD-10-CM

## 2019-08-26 DIAGNOSIS — H25813 Combined forms of age-related cataract, bilateral: Secondary | ICD-10-CM

## 2019-08-26 DIAGNOSIS — I10 Essential (primary) hypertension: Secondary | ICD-10-CM

## 2019-08-26 DIAGNOSIS — H18519 Endothelial corneal dystrophy, unspecified eye: Secondary | ICD-10-CM

## 2019-08-26 NOTE — Progress Notes (Signed)
[facility] Note  08/26/2019     CHIEF COMPLAINT Patient presents for Retina Evaluation   HISTORY OF PRESENT ILLNESS: KLAUDIA BEIRNE is a 64 y.o. female who presents to the clinic today for:   HPI    Retina Evaluation    In both eyes.  This started 1 day ago.  Duration of 1 day.  Context:  near vision.  I, the attending physician,  performed the HPI with the patient and updated documentation appropriately.          Comments    Patient was not aware of the decrease in OD until yesterday when she went to see Dr. Einar Gip.  She wears her glasses mostly for reading.  Denies distortions, FOLs, or floaters.  MD OD, HTN OU       Last edited by Bernarda Caffey, MD on 08/26/2019  2:07 PM. (History)    pt is here on the referral of Dr. Newman Nip for retinal eval, pt states she's not sure why she was sent here, pts vision is better on the eye chart today than it was in [facility] office, pt states she has no eye problems other than wearing glasses and   Referring physician: Webb Laws, [address],  [place] [id number]  HISTORICAL INFORMATION:   Selected notes from the [facility] Referral from Dr. Einar Gip on 08/25/19 for ret eval MRx: OD: +0.25+1.25x090 20/50+2           OS: +0.50+1.00x140 20/25+2 IOP 11 OU   CURRENT MEDICATIONS: No current outpatient medications on file. (Ophthalmic Drugs)   No current facility-administered medications for this visit. (Ophthalmic Drugs)   Current Outpatient Medications (Other)  Medication Sig  . albuterol (VENTOLIN HFA) 108 (90 Base) MCG/ACT inhaler Inhale 2 puffs into the lungs as needed. For wheezing/shortness of breath  . fluticasone (FLONASE) 50 MCG/ACT nasal spray Place 2 sprays into both nostrils daily.  . fluticasone furoate-vilanterol (BREO ELLIPTA) 200-25 MCG/INH AEPB Inhale 1 puff into the lungs daily.  . hydrochlorothiazide (HYDRODIURIL) 25 MG tablet Take 1 tablet  (25 mg total) by mouth daily.  Marland Kitchen lisinopril (ZESTRIL) 5 MG tablet TAKE 1 TABLET BY MOUTH DAILY  . ondansetron (ZOFRAN ODT) 4 MG disintegrating tablet Take 1 tablet (4 mg total) by mouth every 8 (eight) hours as needed for nausea or vomiting.  . pantoprazole (PROTONIX) 40 MG tablet Take 1 tablet (40 mg total) by mouth daily.  . pravastatin (PRAVACHOL) 40 MG tablet Take 1 tablet (40 mg total) by mouth daily.   No current facility-administered medications for this visit. (Other)      REVIEW OF SYSTEMS: ROS    Positive for: Gastrointestinal, Cardiovascular, Eyes, Respiratory   Last edited by Leonie Douglas, COA on 08/26/2019  1:37 PM. (History)       ALLERGIES No Known Allergies  PAST MEDICAL HISTORY Past Medical History:  Diagnosis Date  . Allergy   . Chronic bronchitis 1991  . Hyperlipidemia    not on any current medication. Had been on lipitor in the past  . Hypertension   . Musculoskeletal chest pain    Past Surgical History:  Procedure Laterality Date  . ABDOMINAL HYSTERECTOMY  1981   removal of right ovary and uterus for infection of the uterus  . CHOLECYSTECTOMY     1979    FAMILY HISTORY Family History  Problem Relation Age of Onset  . Sickle cell anemia Other   . Alzheimer's disease Mother   .  Hyperlipidemia Mother   . Hypertension Mother   . Emphysema Father   . Alcohol abuse Sister   . Breast cancer Sister   . Asthma Brother   . Cervical cancer Daughter     SOCIAL HISTORY Social History   Tobacco Use  . Smoking status: Current Every Day Smoker    Packs/day: 1.00    Types: Cigarettes  . Smokeless tobacco: Never Used  . Tobacco comment: decreased smoking  Substance Use Topics  . Alcohol use: Yes    Alcohol/week: 0.0 standard drinks  . Drug use: No         OPHTHALMIC EXAM:  Base Eye Exam    Visual Acuity (Snellen - Linear)      Right Left   Dist cc 20/50 -2 20/30 -2   Dist ph cc 20/30 20/25 -2       Tonometry (Tonopen, 1:44 PM)       Right Left   Pressure 16 14       Pupils      Dark Light Shape React APD   Right 3 2 Round Brisk None   Left 3 2 Round Brisk None       Visual Fields      Left Right    Full Full       Extraocular Movement      Right Left    Full Full       Neuro/Psych    Oriented x3: Yes   Mood/Affect: Normal       Dilation    Both eyes: 1.0% Mydriacyl, 2.5% Phenylephrine @ 1:51 PM        Slit Lamp and Fundus Exam    Slit Lamp Exam      Right Left   Lids/Lashes Dermatochalasis - upper lid Dermatochalasis - upper lid   Conjunctiva/Sclera mild Melanosis mild Melanosis   Cornea 1+ Punctate epithelial erosions, 1-2+ Guttata 1+ Punctate epithelial erosions, 2+ Guttata   Anterior Chamber deep, narrow temporal angle deep, narrow temporal angle   Iris Round and dilated, +PPM Round and dilated, +PPM   Lens 3+ Nuclear sclerosis with brunescence, 2+ Cortical cataract 3+ Nuclear sclerosis with brunescence, 3+ Cortical cataract greatest inferiorly   Vitreous Vitreous syneresis Vitreous syneresis       Fundus Exam      Right Left   Disc Pink and Sharp, Compact, elevated, ?disc drusen Pink and Sharp, Compact, elevated, ?disc drusen   C/D Ratio 0.2 0.2   Macula Flat, Good foveal reflex, mild Retinal pigment epithelial mottling, No heme or edema Flat, Good foveal reflex, mild Retinal pigment epithelial mottling, No heme or edema   Vessels Vascular attenuation, Tortuous, AV crossing changes Vascular attenuation, Tortuous, AV crossing changes   Periphery Attached, no heme  Attached, no heme         Refraction    Wearing Rx      Sphere Cylinder Axis Add   Right [place] +1.50 093 +1.75   Left +0.75 +1.25 155 +1.75       Manifest Refraction      Sphere Cylinder Axis Dist VA   Right [place] +1.00 095 20/40   Left +1.00 +0.75 155 20/30          IMAGING AND PROCEDURES  Imaging and Procedures for '@TODAY' @  OCT, Retina - OU - Both Eyes       Right Eye Quality was good. Central Foveal  Thickness: 236. Progression has no prior data. Findings include normal foveal contour, no IRF, no  SRF, vitreomacular adhesion  (Elevated disc caught on widefield).   Left Eye Quality was good. Central Foveal Thickness: 223. Progression has no prior data. Findings include normal foveal contour, no IRF, no SRF (Elevated disc caught on widefield).   Notes *Images captured and stored on drive  Diagnosis / Impression:  NFP, no IRF/SRF OU Elevated disc caught on widefield OU -- optic disc drusen?  Clinical management:  See below  Abbreviations: NFP - Normal foveal profile. CME - cystoid macular edema. PED - pigment epithelial detachment. IRF - intraretinal fluid. SRF - subretinal fluid. EZ - ellipsoid zone. ERM - epiretinal membrane. ORA - outer retinal atrophy. ORT - outer retinal tubulation. SRHM - subretinal hyper-reflective material                 ASSESSMENT/PLAN:    ICD-10-CM   1. Combined forms of age-related cataract of both eyes  H25.813   2. Retinal edema  H35.81 OCT, Retina - OU - Both Eyes  3. Essential hypertension  I10   4. Hypertensive retinopathy of both eyes  H35.033   5. Fuchs' endothelial dystrophy  H18.519    1. Mixed cataract OU  - The symptoms of cataract, surgical options, and treatments and risks were discussed with patient.  - discussed diagnosis and progression  - BCVA 20/30 OD, 20/25 OS  - approaching visual significance - may benefit from cataract eval  - management per Dr. Einar Gip  2. 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 Hypertensive retinopathy OU  - discussed importance of tight BP control  - monitor  5. Fuch's Endothelial Dystrophy OU  - mild corneal guttata OU  - no corneal edema or haze  - monitor  Ophthalmic Meds Ordered this visit:  No orders  of the defined types were placed in this encounter.     Return if symptoms worsen or fail to improve.  There are no Patient Instructions on file for this visit.   Explained the diagnoses, plan, and follow up with the patient and they expressed understanding.  Patient expressed understanding of the importance of proper follow up care.  This document serves as a record of services personally performed by Gardiner Sleeper, MD, PhD. It was created on their behalf by Estill Bakes, COT an ophthalmic technician. The creation of this record is the provider's dictation and/or activities during the visit.    Electronically signed by: Estill Bakes, COT 08/26/19 @ 1:08 PM   This document serves as a record of services personally performed by Gardiner Sleeper, MD, PhD. It was created on their behalf by Ernest Mallick, OA, an ophthalmic assistant. The creation of this record is the provider's dictation and/or activities during the visit.    Electronically signed by: Ernest Mallick, OA 05.04.2021 1:08 PM   Gardiner Sleeper, M.D., Ph.D. Diseases & Surgery of the Retina and Vitreous [facility]  I have reviewed the above documentation for accuracy and completeness, and I agree with the above. Gardiner Sleeper, M.D., Ph.D. 08/27/19 1:08 PM    Abbreviations: M myopia (nearsighted); A astigmatism; H hyperopia (farsighted); P presbyopia; Mrx spectacle prescription;  CTL contact lenses; OD right eye; OS left eye; OU both eyes  XT exotropia; ET esotropia; PEK punctate epithelial  keratitis; PEE punctate epithelial erosions; DES dry eye syndrome; MGD meibomian gland dysfunction; ATs artificial tears; PFAT's preservative free artificial tears; [place] nuclear sclerotic cataract; PSC posterior subcapsular cataract; ERM epi-retinal membrane; PVD posterior vitreous detachment; RD retinal detachment; DM diabetes mellitus; DR diabetic retinopathy; NPDR non-proliferative diabetic retinopathy; PDR  proliferative diabetic retinopathy; CSME clinically significant macular edema; DME diabetic macular edema; dbh dot blot hemorrhages; CWS cotton wool spot; POAG primary open angle glaucoma; C/D cup-to-disc ratio; HVF humphrey visual field; GVF goldmann visual field; OCT optical coherence tomography; IOP intraocular pressure; BRVO Branch retinal vein occlusion; CRVO central retinal vein occlusion; CRAO central retinal artery occlusion; BRAO branch retinal artery occlusion; RT retinal tear; SB scleral buckle; PPV pars plana vitrectomy; VH Vitreous hemorrhage; PRP panretinal laser photocoagulation; IVK intravitreal kenalog; VMT vitreomacular traction; MH Macular hole;  NVD neovascularization of the disc; NVE neovascularization elsewhere; AREDS age related eye disease study; ARMD age related macular degeneration; POAG primary open angle glaucoma; EBMD epithelial/anterior basement membrane dystrophy; ACIOL anterior chamber intraocular lens; IOL intraocular lens; PCIOL posterior chamber intraocular lens; Phaco/IOL phacoemulsification with intraocular lens placement; [place] photorefractive keratectomy; LASIK laser assisted in situ keratomileusis; HTN hypertension; DM diabetes mellitus; COPD chronic obstructive pulmonary disease

## 2019-09-05 ENCOUNTER — Other Ambulatory Visit: Payer: Self-pay | Admitting: *Deleted

## 2019-09-05 ENCOUNTER — Other Ambulatory Visit: Payer: Self-pay

## 2019-09-05 MED ORDER — BREO ELLIPTA 200-25 MCG/INH IN AEPB: 1.0000 | INHALATION_SPRAY | Freq: Every day | RESPIRATORY_TRACT | 0 refills | Status: DC

## 2019-09-08 ENCOUNTER — Encounter (INDEPENDENT_AMBULATORY_CARE_PROVIDER_SITE_OTHER): Payer: Self-pay | Admitting: Ophthalmology

## 2019-10-08 ENCOUNTER — Other Ambulatory Visit: Payer: Self-pay | Admitting: *Deleted

## 2019-10-08 DIAGNOSIS — I10 Essential (primary) hypertension: Secondary | ICD-10-CM

## 2019-10-09 ENCOUNTER — Other Ambulatory Visit: Payer: Self-pay

## 2019-10-09 MED ORDER — HYDROCHLOROTHIAZIDE 25 MG PO TABS: 25.0000 mg | ORAL_TABLET | Freq: Every day | ORAL | 0 refills | Status: DC

## 2019-10-20 ENCOUNTER — Other Ambulatory Visit: Payer: Self-pay

## 2019-10-20 ENCOUNTER — Emergency Department (HOSPITAL_COMMUNITY)
Admission: EM | Admit: 2019-10-20 | Discharge: 2019-10-20 | Disposition: A | Discharge status: Home / Self Care | Payer: 59 | Attending: Emergency Medicine | Admitting: Emergency Medicine

## 2019-10-20 ENCOUNTER — Encounter (HOSPITAL_COMMUNITY): Payer: Self-pay | Admitting: Emergency Medicine

## 2019-10-20 ENCOUNTER — Emergency Department (HOSPITAL_COMMUNITY): Payer: 59

## 2019-10-20 DIAGNOSIS — F1010 Alcohol abuse, uncomplicated: Secondary | ICD-10-CM | POA: Diagnosis not present

## 2019-10-20 DIAGNOSIS — R1013 Epigastric pain: Secondary | ICD-10-CM | POA: Diagnosis present

## 2019-10-20 DIAGNOSIS — Z79899 Other long term (current) drug therapy: Secondary | ICD-10-CM | POA: Insufficient documentation

## 2019-10-20 DIAGNOSIS — F17210 Nicotine dependence, cigarettes, uncomplicated: Secondary | ICD-10-CM | POA: Diagnosis not present

## 2019-10-20 DIAGNOSIS — I10 Essential (primary) hypertension: Secondary | ICD-10-CM | POA: Diagnosis not present

## 2019-10-20 DIAGNOSIS — K2920 Alcoholic gastritis without bleeding: Secondary | ICD-10-CM | POA: Insufficient documentation

## 2019-10-20 LAB — LIPASE, BLOOD: Lipase: 71 U/L — ABNORMAL HIGH (ref 11–51)

## 2019-10-20 LAB — COMPREHENSIVE METABOLIC PANEL
ALT: 20 U/L (ref 0–44)
AST: 22 U/L (ref 15–41)
Albumin: 3.8 g/dL (ref 3.5–5.0)
Alkaline Phosphatase: 131 U/L — ABNORMAL HIGH (ref 38–126)
Anion gap: 11 (ref 5–15)
BUN: 7 mg/dL — ABNORMAL LOW (ref 8–23)
CO2: 23 mmol/L (ref 22–32)
Calcium: 10.3 mg/dL (ref 8.9–10.3)
Chloride: 103 mmol/L (ref 98–111)
Creatinine, Ser: 0.77 mg/dL (ref 0.44–1.00)
GFR calc Af Amer: >60 mL/min (ref >60)
GFR calc non Af Amer: >60 mL/min (ref >60)
Glucose, Bld: 111 mg/dL — ABNORMAL HIGH (ref 70–99)
Potassium: 4.1 mmol/L (ref 3.5–5.1)
Sodium: 137 mmol/L (ref 135–145)
Total Bilirubin: 0.8 mg/dL (ref 0.3–1.2)
Total Protein: 7.9 g/dL (ref 6.5–8.1)

## 2019-10-20 LAB — URINALYSIS, ROUTINE W REFLEX MICROSCOPIC
Bilirubin Urine: NEGATIVE
Glucose, UA: NEGATIVE mg/dL
Hgb urine dipstick: NEGATIVE
Ketones, ur: NEGATIVE mg/dL
Leukocytes,Ua: NEGATIVE
Nitrite: NEGATIVE
Protein, ur: NEGATIVE mg/dL
Specific Gravity, Urine: 1.03 (ref 1.005–1.030)
pH: 6 (ref 5.0–8.0)

## 2019-10-20 LAB — CBC
HCT: 44.6 % (ref 36.0–46.0)
Hemoglobin: 14.8 g/dL (ref 12.0–15.0)
MCH: 31.8 pg (ref 26.0–34.0)
MCHC: 33.2 g/dL (ref 30.0–36.0)
MCV: 95.7 fL (ref 80.0–100.0)
Platelets: 273 10*3/uL (ref 150–400)
RBC: 4.66 MIL/uL (ref 3.87–5.11)
RDW: 13.4 % (ref 11.5–15.5)
WBC: 9.1 10*3/uL (ref 4.0–10.5)
nRBC: 0 % (ref 0.0–0.2)

## 2019-10-20 MED ORDER — OMEPRAZOLE 20 MG PO CPDR
20.0000 mg | DELAYED_RELEASE_CAPSULE | Freq: Every day | ORAL | 0 refills | Status: DC
Start: 2019-10-20 — End: 2021-01-12

## 2019-10-20 MED ORDER — ALUM & MAG HYDROXIDE-SIMETH 200-200-20 MG/5ML PO SUSP
30.0000 mL | Freq: Once | ORAL | Status: AC
  Administered 2019-10-20: 30 mL via ORAL
  Filled 2019-10-20: qty 30

## 2019-10-20 MED ORDER — SODIUM CHLORIDE 0.9 % IV BOLUS
1000.0000 mL | Freq: Once | INTRAVENOUS | Status: AC
  Administered 2019-10-20: 1000 mL via INTRAVENOUS

## 2019-10-20 MED ORDER — IOHEXOL 300 MG/ML  SOLN
100.0000 mL | Freq: Once | INTRAMUSCULAR | Status: AC | PRN
  Administered 2019-10-20: 100 mL via INTRAVENOUS

## 2019-10-20 MED ORDER — FENTANYL CITRATE (PF) 100 MCG/2ML IJ SOLN
25.0000 ug | Freq: Once | INTRAMUSCULAR | Status: AC
  Administered 2019-10-20: 25 ug via INTRAVENOUS
  Filled 2019-10-20: qty 2

## 2019-10-20 MED ORDER — ONDANSETRON HCL 4 MG/2ML IJ SOLN
4.0000 mg | Freq: Once | INTRAMUSCULAR | Status: AC
  Administered 2019-10-20: 4 mg via INTRAVENOUS
  Filled 2019-10-20: qty 2

## 2019-10-20 MED ORDER — FAMOTIDINE IN NACL 20-0.9 MG/50ML-% IV SOLN
20.0000 mg | Freq: Once | INTRAVENOUS | Status: AC
  Administered 2019-10-20: 20 mg via INTRAVENOUS
  Filled 2019-10-20: qty 50

## 2019-10-20 MED ORDER — SODIUM CHLORIDE 0.9% FLUSH
3.0000 mL | Freq: Once | INTRAVENOUS | Status: AC
  Administered 2019-10-20: 3 mL via INTRAVENOUS

## 2019-10-20 NOTE — ED Triage Notes (Signed)
Pt reports upper abd pain since Friday with some nausea, states she took a laxative thinking that would help but it did not. resp e/u, nad.

## 2019-10-20 NOTE — Discharge Instructions (Signed)
Please take the omeprazole daily to help with stomach acid production. Avoid NSAIDs such as ibuprofen/Advil/Motrin, Aleve/naproxen, aspirin, Goody's or BC powder. Tylenol is a safe over-the-counter medication to take for pain. You can also take a Maalox to help with burning pain.  You can take Pepcid every 12 hours as well. Follow close with your primary care provider.  Return for severely worsening pain, high fevers, uncontrollable vomiting.

## 2019-10-20 NOTE — ED Provider Notes (Signed)
[facility] EMERGENCY DEPARTMENT Provider Note   CSN: [id number] Arrival date & time: 10/20/19  1003     History Chief Complaint  Patient presents with  . Abdominal Pain    Sabrina [name] is a 64 y.o. female with past medical history of hypertension, hyperlipidemia, chronic bronchitis, cholecystectomy, presenting to the emergency department with upper abdominal pain that began on Friday.  She states pain is mostly epigastric though radiates around her right upper quadrant onto her right scapula.  It is described with varying qualities, including crampy pain, sharp/stinging pain.  She has had associated nausea with "gagging."  She has taken Tylenol without relief.  She does endorse alcohol use, her last drink was on Thursday.  She states she drinks about a case of beer at a time, a 24 pack.  She states she normally drinks on the weekends, not daily.  Denies associated fevers, chills, diarrhea, constipation, urinary symptoms.  Feels somewhat similar to prior episode of colitis in 2018.  The history is provided by the patient.       Past Medical History:  Diagnosis Date  . Allergy   . Chronic bronchitis 1991  . Hyperlipidemia    not on any current medication. Had been on lipitor in the past  . Hypertension   . Musculoskeletal chest pain     Patient Active Problem List   Diagnosis Date Noted  . Insomnia 08/07/2019  . Tarsal tunnel syndrome 08/07/2019  . Hand weakness 08/07/2019  . Health maintenance examination 08/25/2014  . Onychomycosis 09/19/2013  . Acid reflux 05/08/2012  . History of colon polyps 11/16/2011  . Back pain 10/27/2011  . Hypertension 09/19/2011  . Systolic murmur 15/40/0867  . Obesity (BMI 30.0-34.9) 08/20/2011  . Chronic bronchitis ([place]) 08/18/2011  . Tobacco use disorder 04/25/1971    Past Surgical History:  Procedure Laterality Date  . ABDOMINAL HYSTERECTOMY  1981   removal of right ovary and uterus for infection of the uterus  .  CHOLECYSTECTOMY     1979     OB History   No obstetric history on file.     Family History  Problem Relation Age of Onset  . Sickle cell anemia Other   . Alzheimer's disease Mother   . Hyperlipidemia Mother   . Hypertension Mother   . Emphysema Father   . Alcohol abuse Sister   . Breast cancer Sister   . Asthma Brother   . Cervical cancer Daughter     Social History   Tobacco Use  . Smoking status: Current Every Day Smoker    Packs/day: 1.00    Types: Cigarettes  . Smokeless tobacco: Never Used  . Tobacco comment: decreased smoking  Substance Use Topics  . Alcohol use: Yes    Alcohol/week: 0.0 standard drinks  . Drug use: No    Home Medications Prior to Admission medications   Medication Sig Start Date End Date Taking? Authorizing Provider  albuterol (VENTOLIN HFA) 108 (90 Base) MCG/ACT inhaler Inhale 2 puffs into the lungs as needed. For wheezing/shortness of breath 08/05/19   Guadalupe Dawn, MD  fluticasone [facility]) 50 MCG/ACT nasal spray Place 2 sprays into both nostrils daily. 11/16/15   Archie Patten, MD  fluticasone furoate-vilanterol (BREO ELLIPTA) 200-25 MCG/INH AEPB Inhale 1 puff into the lungs daily. 09/05/19   Guadalupe Dawn, MD  fluticasone furoate-vilanterol (BREO ELLIPTA) 200-25 MCG/INH AEPB Inhale 1 puff into the lungs daily. 09/05/19   Guadalupe Dawn, MD  hydrochlorothiazide (HYDRODIURIL) 25 MG tablet  Take 1 tablet (25 mg total) by mouth daily. 10/09/19   Guadalupe Dawn, MD  lisinopril (ZESTRIL) 5 MG tablet TAKE 1 TABLET BY MOUTH DAILY 02/27/19   Guadalupe Dawn, MD  omeprazole (PRILOSEC) 20 MG capsule Take 1 capsule (20 mg total) by mouth daily. 10/20/19   Danese Dorsainvil, [place] N, PA-C  ondansetron (ZOFRAN ODT) 4 MG disintegrating tablet Take 1 tablet (4 mg total) by mouth every 8 (eight) hours as needed for nausea or vomiting. 10/21/16   Deno Etienne, DO  pantoprazole (PROTONIX) 40 MG tablet Take 1 tablet (40 mg total) by mouth daily. 08/05/19   Guadalupe Dawn, MD  pravastatin (PRAVACHOL) 40 MG tablet Take 1 tablet (40 mg total) by mouth daily. 07/10/19   Guadalupe Dawn, MD  Fluticasone-Salmeterol (ADVAIR) 100-50 MCG/DOSE AEPB Inhale 1 puff into the lungs 2 (two) times daily. 05/21/15 08/05/19  Archie Patten, MD    Allergies    Patient has no known allergies.  Review of Systems   Review of Systems  Gastrointestinal: Positive for abdominal pain and nausea.  All other systems reviewed and are negative.   Physical Exam Updated Vital Signs BP 138/63   Pulse 66   Temp 98.3 F (36.8 C) (Oral)   Resp 18   SpO2 92%   Physical Exam Vitals and nursing note reviewed.  Constitutional:      General: She is not in acute distress.    Appearance: She is well-developed. She is not ill-appearing.  HENT:     Head: Normocephalic and atraumatic.  Eyes:     Conjunctiva/sclera: Conjunctivae normal.  Cardiovascular:     Rate and Rhythm: Normal rate and regular rhythm.  Pulmonary:     Effort: Pulmonary effort is normal. No respiratory distress.     Breath sounds: Normal breath sounds.  Abdominal:     General: Abdomen is flat. Bowel sounds are normal.     Palpations: Abdomen is soft.     Tenderness: There is abdominal tenderness in the right upper quadrant and epigastric area. There is no guarding or rebound.  Skin:    General: Skin is warm.  Neurological:     Mental Status: She is alert.  Psychiatric:        Behavior: Behavior normal.     ED Results / Procedures / Treatments   Labs (all labs ordered are listed, but only abnormal results are displayed) Labs Reviewed  LIPASE, BLOOD - Abnormal; Notable for the following components:      Result Value   Lipase 71 (*)    All other components within normal limits  COMPREHENSIVE METABOLIC PANEL - Abnormal; Notable for the following components:   Glucose, Bld 111 (*)    BUN 7 (*)    Alkaline Phosphatase 131 (*)    All other components within normal limits  CBC  URINALYSIS, ROUTINE W  REFLEX MICROSCOPIC    EKG None  Radiology CT Abdomen Pelvis W Contrast  Result Date: 10/20/2019 CLINICAL DATA:  RIGHT upper quadrant and epigastric abdominal pain since Friday with nausea EXAM: CT ABDOMEN AND PELVIS WITH CONTRAST TECHNIQUE: Multidetector CT imaging of the abdomen and pelvis was performed using the standard protocol following bolus administration of intravenous contrast. Sagittal and coronal MPR images reconstructed from axial data set. CONTRAST:  175mL OMNIPAQUE IOHEXOL 300 MG/ML SOLN IV. No oral contrast. COMPARISON:  10/21/2016 FINDINGS: Lower chest: Lung bases clear Hepatobiliary: Gallbladder surgically absent. Liver normal appearance. Pancreas: Normal appearance Spleen: Normal appearance Adrenals/Urinary Tract: Slight thickening of LEFT adrenal  gland without focal mass. Adrenal glands, kidneys, ureters, and bladder otherwise normal appearance Stomach/Bowel: Normal appearance of retrocecal appendix. Questionable wall thickening of gastric antrum versus artifact from underdistention. Bowel loops unremarkable Vascular/Lymphatic: Atherosclerotic calcifications aorta and iliac arteries without aneurysm. No adenopathy. Reproductive: Uterus surgically absent with nonvisualization of ovaries Other: Moderate-sized umbilical hernia containing fat. No free air or free fluid. No inflammatory process. Musculoskeletal: Diffuse osseous demineralization. No acute osseous findings. IMPRESSION: Moderate-sized umbilical hernia containing fat. Questionable wall thickening of the gastric antrum versus artifact from underdistention; recommend correlation with patient's symptoms and consider follow-up upper GI exam or endoscopy to exclude gastritis. No other intra-abdominal or intrapelvic abnormalities. Aortic Atherosclerosis (ICD10-I70.0). Electronically Signed   By: Lavonia Dana M.D.   On: 10/20/2019 14:52    Procedures Procedures (including critical care time)  Medications Ordered in ED Medications    sodium chloride flush (NS) 0.9 % injection 3 mL (3 mLs Intravenous Given 10/20/19 1553)  ondansetron (ZOFRAN) injection 4 mg (4 mg Intravenous Given 10/20/19 1348)  sodium chloride 0.9 % bolus 1,000 mL (1,000 mLs Intravenous New Bag/Given 10/20/19 1348)  fentaNYL (SUBLIMAZE) injection 25 mcg (25 mcg Intravenous Given 10/20/19 1348)  iohexol (OMNIPAQUE) 300 MG/ML solution 100 mL (100 mLs Intravenous Contrast Given 10/20/19 1425)  famotidine (PEPCID) IVPB 20 mg premix (20 mg Intravenous New Bag/Given 10/20/19 1551)  alum & mag hydroxide-simeth (MAALOX/MYLANTA) 200-200-20 MG/5ML suspension 30 mL (30 mLs Oral Given 10/20/19 1548)    ED Course  I have reviewed the triage vital signs and the nursing notes.  Pertinent labs & imaging results that were available during my care of the patient were reviewed by me and considered in my medical decision making (see chart for details).  Clinical Course as of Oct 20 1623  Mon Oct 20, 2019  1618 Discussed results with patient. Feels a little better. Likely alcohol induced gastritis. She endorses NSAID use as well. Counseled on OTC medications, and will rx PPI.   [JR]    Clinical Course User Index [JR] Eziah Negro, [place] N, PA-C   MDM Rules/Calculators/A&P                          Patient with symptoms consistent with gastritis, likely alcoholic in nature.  Vitals are stable, no fever or tachycardia.  Patient is nontoxic, nonseptic appearing, in no apparent distress.  Patient does not meet the SIRS or Sepsis criteria.  Pt's symptoms have been managed in the department; fluid bolus given.  No signs of dehydration, tolerating PO fluids > 6 oz.  Lungs are clear.  CT scan w findings consistent with gastritis. Labs are reassuring. Supportive therapy indicated.  Patient counseled, expresses understanding and agrees with plan.  Discussed results, findings, treatment and follow up. Patient advised of return precautions. Patient verbalized understanding and agreed with  plan.  Final Clinical Impression(s) / ED Diagnoses Final diagnoses:  Acute alcoholic gastritis without hemorrhage    Rx / DC Orders ED Discharge Orders         Ordered    omeprazole (PRILOSEC) 20 MG capsule  Daily     Discontinue  Reprint     10/20/19 1621           Lean Jaeger, [place] N, PA-C 10/20/19 1625    Davonna Belling, MD 10/21/19 1506

## 2019-10-23 ENCOUNTER — Other Ambulatory Visit: Payer: Self-pay

## 2019-10-23 ENCOUNTER — Ambulatory Visit (INDEPENDENT_AMBULATORY_CARE_PROVIDER_SITE_OTHER): Payer: 59 | Admitting: Family Medicine

## 2019-10-23 VITALS — BP 125/60 | HR 66 | Ht 64.0 in | Wt 153.8 lb

## 2019-10-23 DIAGNOSIS — K589 Irritable bowel syndrome without diarrhea: Secondary | ICD-10-CM | POA: Diagnosis not present

## 2019-10-23 DIAGNOSIS — K2920 Alcoholic gastritis without bleeding: Secondary | ICD-10-CM | POA: Insufficient documentation

## 2019-10-23 MED ORDER — DICYCLOMINE HCL 10 MG PO CAPS: 10.0000 mg | ORAL_CAPSULE | Freq: Three times a day (TID) | ORAL | 0 refills | Status: DC | PRN

## 2019-10-23 NOTE — Patient Instructions (Addendum)
Thank you for coming to see me today. It was a pleasure to see you.   Please take Protonix twice a day every day.  I recommend a trial of Gaviscon, which is over-the-counter and really good with stomach pains.  I have also called in a prescription called Bentyl which will help with those lower abdominal pains that you are having. You can take this 3 times a day as needed.  You can continue the Tylenol as needed for breakthrough pain.  Please follow-up with your PCP Dr. Ouida Sills if no improvement in symptoms within 7 days. You should follow-up sooner or go to the ED if you develop worsening pains or if you develop any signs of bleeding such as dark stools, red stools, or vomiting blood.  If you have any questions or concerns, please do not hesitate to call the office at [phone].  Take Care,  Dr. Mina Marble, DO Resident Physician [place] [phone]

## 2019-10-23 NOTE — Assessment & Plan Note (Signed)
History and physical exam appear consistent with colonic spasms.  No red flag symptoms.  Do not feel imaging is indicated at this time.  Will do trial of Bentyl 3 times daily as needed to evaluate for improvement.  RTC if no improvement or worsening.  Strict return precautions discussed.  Patient voiced understanding agreement plan.

## 2019-10-23 NOTE — Progress Notes (Signed)
Patient given PHQ2 and 9.   Provider aware.  .Michelle R Simpson, CMA  

## 2019-10-23 NOTE — Assessment & Plan Note (Signed)
Acute, improving.  Doing well with PPI.  No acute bleed or worsening of symptoms.  - Recommended increasing dose to twice daily dosing to help with symptoms.  Once improved she may go back to once a day dosing.  - Pepcid, Maalox, or Gaviscon acid reducing medications to help with breakthrough pain -Discussed importance of alcohol cessation to avoid recurrence.  Patient appeared amendable to reduction in consumption.

## 2019-10-23 NOTE — Progress Notes (Signed)
Subjective:   Patient ID: Sabrina [name]    DOB: [birth year], 64 y.o. female   MRN: [id number]  Sabrina [name] is a 64 y.o. female with a history of hypertension, chronic bronchitis, acid reflux, tarsal tunnel syndrome, onychomycosis, obesity, tobacco use disorder, back pain, history of colon polyps, here for hospital follow-up.  Acute Alcoholic Gastritis: Patient presented to ED on 10/20/2019 for upper abdominal pain that began on Friday and found to have gastritis by CT. Patient has a history of alcohol abuse where she drinks about a 24 pack case of beer at a time.  She denies any fevers, chills, diarrhea, constipation, urinary symptoms.  Patient has a history of colitis and this felt similar.  She was hemodynamically stable and labs were reassuring.  She was given a fluid bolus.  She was tolerating p.o.  She was discharged with omeprazole 20 mg daily which she endorses compliance.  Today patient notes she has been taking the omeprazole daily and a acid reducing liquid 3 times a day.  She notes her epigastric pain has improved.  Denies any fevers, chills, nausea, vomiting, diarrhea, constipation, hematemesis, hematochezia, melena. Denies sudden worsening pain. Tolerating PO.  Last bowel movement was yesterday that was soft and brown. Denies NSAID use. Notes she drinks a case of beer on the weekends.  In addition patient is complaining of intermittent sharp shooting pain across her lower abdomen that comes and goes, lasts a few minutes, comes back in a few hours later.  She notes that this discomfort is relieved slightly with passing of gas and bowel movements.  She has been treating the pain with Tylenol which helps temporarily but comes back after a while.  Denies constipation as described above.  Of note patient has history of a colonoscopy in 2013 that showed a 4 mm polyp in the sigmoid colon otherwise normal colonoscopy.  Review of Systems:  Per HPI.   Objective:   BP 125/60   Pulse 66   Ht  5\' 4"  (1.626 m)   Wt 153 lb 12.8 oz (69.8 kg)   SpO2 99%   BMI 26.40 kg/m  Vitals and nursing note reviewed.  General: Pleasant older female, sitting comfortably in exam chair, well nourished, well developed, in no acute distress with non-toxic appearance Resp: Speaking in full sentences, breathing comfortably on room air Abdomen: moderately tender to epigastric region to palpation, mildly tender along lower abdomen bilaterally to deep palpation, no palpable masses or organomegaly appreciated, abdomen was nondistended and soft, normoactive bowel sounds Skin: warm, dry Extremities: warm and well perfused MSK:  gait normal Neuro: Alert and oriented, speech normal  Prior imaging/labs:  CT abdomen and pelvis with contrast: 10/20/19 IMPRESSION: Moderate-sized umbilical hernia containing fat.  Questionable wall thickening of the gastric antrum versus artifact from underdistention; recommend correlation with patient's symptoms and consider follow-up upper GI exam or endoscopy to exclude gastritis.  No other intra-abdominal or intrapelvic abnormalities. He had Colonoscopy 12/05/2011 Single polyp in sigmoid colon measuring 4 mm diameter.  Remainder of exam normal.  Assessment & Plan:   Acute alcoholic gastritis without hemorrhage Acute, improving.  Doing well with PPI.  No acute bleed or worsening of symptoms.  - Recommended increasing dose to twice daily dosing to help with symptoms.  Once improved she may go back to once a day dosing.  - Pepcid, Maalox, or Gaviscon acid reducing medications to help with breakthrough pain -Discussed importance of alcohol cessation to avoid recurrence.  Patient appeared amendable to reduction  in consumption.  Colon spasm History and physical exam appear consistent with colonic spasms.  No red flag symptoms.  Do not feel imaging is indicated at this time.  Will do trial of Bentyl 3 times daily as needed to evaluate for improvement.  RTC if no improvement  or worsening.  Strict return precautions discussed.  Patient voiced understanding agreement plan.  No orders of the defined types were placed in this encounter.  Meds ordered this encounter  Medications  . dicyclomine (BENTYL) 10 MG capsule    Sig: Take 1 capsule (10 mg total) by mouth 3 (three) times daily as needed for up to 7 days for spasms. Take before a meal    Dispense:  21 capsule    Refill:  0    Mina Marble, DO PGY-3, [facility] 10/23/2019 4:33 PM

## 2020-01-01 ENCOUNTER — Other Ambulatory Visit: Payer: Self-pay | Admitting: Family Medicine

## 2020-01-01 DIAGNOSIS — I10 Essential (primary) hypertension: Secondary | ICD-10-CM

## 2020-01-12 ENCOUNTER — Other Ambulatory Visit: Payer: Self-pay

## 2020-01-12 MED ORDER — BREO ELLIPTA 200-25 MCG/INH IN AEPB: 1.0000 | INHALATION_SPRAY | Freq: Every day | RESPIRATORY_TRACT | 1 refills | Status: DC

## 2020-03-31 ENCOUNTER — Other Ambulatory Visit: Payer: Self-pay | Admitting: Student in an Organized Health Care Education/Training Program

## 2020-03-31 DIAGNOSIS — I10 Essential (primary) hypertension: Secondary | ICD-10-CM

## 2020-04-05 ENCOUNTER — Other Ambulatory Visit: Payer: Self-pay | Admitting: *Deleted

## 2020-04-05 DIAGNOSIS — I10 Essential (primary) hypertension: Secondary | ICD-10-CM

## 2020-04-05 MED ORDER — LISINOPRIL 5 MG PO TABS
5.0000 mg | ORAL_TABLET | Freq: Every day | ORAL | 0 refills | Status: DC
Start: 2020-04-05 — End: 2020-06-18

## 2020-06-18 ENCOUNTER — Other Ambulatory Visit: Payer: Self-pay | Admitting: Student in an Organized Health Care Education/Training Program

## 2020-06-18 DIAGNOSIS — I10 Essential (primary) hypertension: Secondary | ICD-10-CM

## 2020-06-29 ENCOUNTER — Other Ambulatory Visit: Payer: Self-pay | Admitting: Family Medicine

## 2020-06-29 ENCOUNTER — Other Ambulatory Visit: Payer: Self-pay | Admitting: Student in an Organized Health Care Education/Training Program

## 2020-06-29 DIAGNOSIS — I10 Essential (primary) hypertension: Secondary | ICD-10-CM

## 2020-06-30 NOTE — Telephone Encounter (Signed)
Called patient X 3.  There was no answer and no ability to leave a message.  Ozella Almond, [place]

## 2020-08-19 ENCOUNTER — Other Ambulatory Visit: Payer: Self-pay | Admitting: Student in an Organized Health Care Education/Training Program

## 2020-08-19 DIAGNOSIS — I10 Essential (primary) hypertension: Secondary | ICD-10-CM

## 2020-09-22 ENCOUNTER — Other Ambulatory Visit: Payer: Self-pay | Admitting: Family Medicine

## 2020-09-22 DIAGNOSIS — Z1231 Encounter for screening mammogram for malignant neoplasm of breast: Secondary | ICD-10-CM

## 2020-09-24 ENCOUNTER — Ambulatory Visit
Admission: RE | Admit: 2020-09-24 | Discharge: 2020-09-24 | Disposition: A | Discharge status: Home / Self Care | Payer: 59 | Source: Ambulatory Visit | Attending: Family Medicine | Admitting: Family Medicine

## 2020-09-24 ENCOUNTER — Other Ambulatory Visit: Payer: Self-pay | Admitting: Student in an Organized Health Care Education/Training Program

## 2020-09-24 ENCOUNTER — Other Ambulatory Visit: Payer: Self-pay

## 2020-09-24 DIAGNOSIS — Z1231 Encounter for screening mammogram for malignant neoplasm of breast: Secondary | ICD-10-CM

## 2020-09-24 DIAGNOSIS — I10 Essential (primary) hypertension: Secondary | ICD-10-CM

## 2020-09-27 NOTE — Telephone Encounter (Signed)
Looks like my patient will be seeing you this week. Could you please get a CMP (along with whatever else you see fit during your visit)? I am refilling her HCTZ x30 days until then.

## 2020-09-29 ENCOUNTER — Other Ambulatory Visit: Payer: Self-pay

## 2020-09-29 ENCOUNTER — Ambulatory Visit (INDEPENDENT_AMBULATORY_CARE_PROVIDER_SITE_OTHER): Payer: 59 | Admitting: Family Medicine

## 2020-09-29 ENCOUNTER — Encounter: Payer: Self-pay | Admitting: Family Medicine

## 2020-09-29 ENCOUNTER — Telehealth: Payer: Self-pay | Admitting: Family Medicine

## 2020-09-29 DIAGNOSIS — J42 Unspecified chronic bronchitis: Secondary | ICD-10-CM | POA: Diagnosis not present

## 2020-09-29 DIAGNOSIS — F17200 Nicotine dependence, unspecified, uncomplicated: Secondary | ICD-10-CM

## 2020-09-29 DIAGNOSIS — Z789 Other specified health status: Secondary | ICD-10-CM

## 2020-09-29 DIAGNOSIS — I10 Essential (primary) hypertension: Secondary | ICD-10-CM

## 2020-09-29 DIAGNOSIS — R748 Abnormal levels of other serum enzymes: Secondary | ICD-10-CM

## 2020-09-29 DIAGNOSIS — Z7289 Other problems related to lifestyle: Secondary | ICD-10-CM

## 2020-09-29 DIAGNOSIS — E785 Hyperlipidemia, unspecified: Secondary | ICD-10-CM | POA: Diagnosis not present

## 2020-09-29 NOTE — Assessment & Plan Note (Signed)
Mostly precontemplative See after visit summary.

## 2020-09-29 NOTE — Assessment & Plan Note (Signed)
Unsure of control.  Check labs today

## 2020-09-29 NOTE — Assessment & Plan Note (Signed)
Likely cause of her chronic cough.  Needs lung cancer CT screening.  Asked her follow up with her new PCP

## 2020-09-29 NOTE — Progress Notes (Addendum)
    SUBJECTIVE:   CHIEF COMPLAINT / HPI:   Here for a physical.  She has not been seen for a while.  No specific complaints other than her chronic cough  Cough - smokes 1ppd for many years.  Has tried medications and counseling in past.  Would like to stop but unsure how much   Alcohol - drinks intermittently sometimes a case of light beer in a day.   Was surprised to hear should not drink more than 1-2 per day  Does not exercise regularly  PERTINENT  PMH / PSH: Hypertension, chronic bronchitis, hyperlipidemia  Medications - brings in her medications and knows the names.  Not using Breo regularly  OBJECTIVE:   BP (!) 144/76   Pulse (!) 104   Wt 166 lb 12.8 oz (75.7 kg)   SpO2 100%   BMI 28.63 kg/m   Neck:  No deformities, thyromegaly, masses, or tenderness noted.   Supple with full range of motion without pain. Heart - Regular rate and rhythm.  No murmurs, gallops or rubs.    Lungs:  Normal respiratory effort, chest expands symmetrically. Lungs are clear to auscultation, no crackles or wheezes. Abdomen: soft and non-tender without masses, organomegaly or hernias noted.  No guarding or rebound Extremities:  No cyanosis, edema, or deformity noted with good range of motion of all major joints.   Mobility:able to get up and down from exam table without assistance or distress   ASSESSMENT/PLAN:   Hypertension Not at goal today.  See after visit summary. Asked her to monitor at home and follow up with new PCP.  Will check CMET today   Chronic bronchitis Likely cause of her chronic cough.  Needs lung cancer CT screening.  Asked her follow up with her new PCP  Hyperlipidemia Unsure of control.  Check labs today   Tobacco use disorder Mostly precontemplative See after visit summary.    Alcohol use Discussed her current level of consumption and suggested safe practices.  See after visit summary   Elevated alkaline phosphatase level Lab Results  Component Value Date    ALKPHOS 155 (H) 09/29/2020   Slowly increasing since 2019.     Likely benign but would get a GGT and discuss with her next visit    GI Cancer - from 11/2016 Dr Fuller Plan telephone note "Dr Fuller Plan reviewed previous Pathology & Colonoscopy from [facility] She is not due for another procedure for 10 years from last this date. I called and spoke to Ms Ridinger. Advised her I will place a reminder in computer to reach out to her in August 2023."  Lind Covert, MD [place]

## 2020-09-29 NOTE — Assessment & Plan Note (Signed)
Discussed her current level of consumption and suggested safe practices.  See after visit summary

## 2020-09-29 NOTE — Assessment & Plan Note (Signed)
Not at goal today.  See after visit summary. Asked her to monitor at home and follow up with new PCP.  Will check CMET today

## 2020-09-29 NOTE — Patient Instructions (Addendum)
Good to see you today - Thank you for coming in  Things we discussed today:  Use the Breo every day and the Albuterol only as needed   - Alcohol - not more than 2 beers per day  You need to stop smoking!  Call [facility] (1-800-QUIT-NOW) for ideas  Choose a quit date when you will stop completely.  Get prepared by slowly cutting down.  Find a substitute to use when you think you need a cigarette.  If you wish to discuss nicotine replacement (patches, gum) please make an appointment  Make an appointment to see Dr Valentina Lucks for smoking cessation   Your goal blood pressure is less than 140/90.  Check your blood pressure several times a week.  If regularly higher than this please let me know - either with MyChart or leaving a phone message. Next visit please bring in your blood pressure cuff.  Check with your insurance about paying for a blood pressure cuff  I will call you if your tests are not good.  Otherwise, I will send you a message on MyChart (if it is active) or a letter in the mail..  If you do not hear from me with in 2 weeks please call our office.     Please always bring your medication bottles  Make an appointment to see your regular doctor for - follow up on your blood pressure  - see how the smoking is going - discuss lung cancer screening - possible need for pap smear - I will look up your hysterectomy

## 2020-09-30 ENCOUNTER — Encounter: Payer: Self-pay | Admitting: Family Medicine

## 2020-09-30 DIAGNOSIS — R748 Abnormal levels of other serum enzymes: Secondary | ICD-10-CM | POA: Insufficient documentation

## 2020-09-30 LAB — CMP14+EGFR
ALT: 30 IU/L (ref 0–32)
AST: 22 IU/L (ref 0–40)
Albumin/Globulin Ratio: 1.5 (ref 1.2–2.2)
Albumin: 4.3 g/dL (ref 3.8–4.8)
Alkaline Phosphatase: 155 IU/L — ABNORMAL HIGH (ref 44–121)
BUN/Creatinine Ratio: 18 (ref 12–28)
BUN: 12 mg/dL (ref 8–27)
Bilirubin Total: <0.2 mg/dL (ref 0.0–1.2)
CO2: 22 mmol/L (ref 20–29)
Calcium: 10.2 mg/dL (ref 8.7–10.3)
Chloride: 104 mmol/L (ref 96–106)
Creatinine, Ser: 0.66 mg/dL (ref 0.57–1.00)
Globulin, Total: 2.9 g/dL (ref 1.5–4.5)
Glucose: 117 mg/dL — ABNORMAL HIGH (ref 65–99)
Potassium: 4.2 mmol/L (ref 3.5–5.2)
Sodium: 141 mmol/L (ref 134–144)
Total Protein: 7.2 g/dL (ref 6.0–8.5)
eGFR: 99 mL/min/{1.73_m2} (ref >59)

## 2020-09-30 LAB — LIPID PANEL
Chol/HDL Ratio: 4.3 ratio (ref 0.0–4.4)
Cholesterol, Total: 164 mg/dL (ref 100–199)
HDL: 38 mg/dL — ABNORMAL LOW (ref >39)
LDL Chol Calc (NIH): 94 mg/dL (ref 0–99)
Triglycerides: 188 mg/dL — ABNORMAL HIGH (ref 0–149)
VLDL Cholesterol Cal: 32 mg/dL (ref 5–40)

## 2020-09-30 NOTE — Telephone Encounter (Signed)
error 

## 2020-09-30 NOTE — Assessment & Plan Note (Signed)
Lab Results  Component Value Date   ALKPHOS 155 (H) 09/29/2020   Slowly increasing since 2019.     Likely benign but would get a GGT and discuss with her next visit

## 2020-10-04 ENCOUNTER — Telehealth: Payer: Self-pay

## 2020-10-04 ENCOUNTER — Other Ambulatory Visit: Payer: Self-pay | Admitting: Student in an Organized Health Care Education/Training Program

## 2020-10-04 NOTE — Telephone Encounter (Signed)
Patient calls nurse line stating she was told by [facility] to get a home blood pressure cuff. Patient reports the pharmacy told her if we sent one in it would be covered by her insurance. Will forward to provider who saw patient.   [facility] on [address]

## 2020-10-06 MED ORDER — BLOOD PRESSURE MONITOR AUTOMAT DEVI: 0 refills | Status: DC

## 2020-10-06 NOTE — Telephone Encounter (Signed)
Pls let her know I sent in Rx for blood pressure cuff  Thanks  LC

## 2020-10-06 NOTE — Telephone Encounter (Signed)
Attempted to call patient and inform her of RX for Blood pressure cuff.  There was no answer and no ability to leave a message.  If patient should call office please advise her of RX at pharmacy.  Sabrina [name], [place]

## 2020-10-06 NOTE — Addendum Note (Signed)
Addended by: Talbert Cage L on: 10/06/2020 09:42 AM   Modules accepted: Orders

## 2020-10-13 ENCOUNTER — Other Ambulatory Visit: Payer: Self-pay | Admitting: Student in an Organized Health Care Education/Training Program

## 2020-10-19 ENCOUNTER — Other Ambulatory Visit: Payer: Self-pay | Admitting: Student in an Organized Health Care Education/Training Program

## 2020-10-19 DIAGNOSIS — I10 Essential (primary) hypertension: Secondary | ICD-10-CM

## 2020-11-08 MED ORDER — BLOOD PRESSURE MONITOR AUTOMAT DEVI: 0 refills | Status: AC

## 2020-11-08 NOTE — Addendum Note (Signed)
Addended by: Dorna Bloom on: 11/08/2020 02:48 PM   Modules accepted: Orders

## 2020-11-08 NOTE — Telephone Encounter (Signed)
Patient calls nurse line report [facility] does not dispense BP cuffs. Patient states she spoke with [facility] on [address] and they do. I have resent to [facility].

## 2020-11-24 ENCOUNTER — Other Ambulatory Visit: Payer: Self-pay | Admitting: Student in an Organized Health Care Education/Training Program

## 2020-11-24 DIAGNOSIS — I10 Essential (primary) hypertension: Secondary | ICD-10-CM

## 2020-12-13 ENCOUNTER — Other Ambulatory Visit: Payer: Self-pay | Admitting: Student in an Organized Health Care Education/Training Program

## 2020-12-19 NOTE — Patient Instructions (Addendum)
It was wonderful to see you today.  Please bring ALL of your medications with you to every visit.   Today we talked about:  -I sent a prescription for Melatonin. Please see below for more instructions on how to help you sleep. -I sent in a prescription for Gas-x and I have refilled your Hydrochlorothiazide. -I am ordering lab tests. You can come in tomorrow morning for a lab only appointment. Please don't eat anything before these labs. I will let you know the results when they return.  -I've ordered a CT of your chest to look for signs of lung cancer. I would encourage you to cut back. Please let me know when you are ready to cut back/quit.  -I am always happy to discuss the COVID vaccine if you ever have any questions.  -Continue to cut back on your alcohol. It is recommended to not have more than 2 drinks a day.   For sleep: - Try the following to help you sleep better: - limit naps during the day - no screens (TV, phone, tablet, computer) at least 1-2 hours before bedtime. - have a quiet and dark sleeping environment. - no large meals or drinks about 1 hour before bed. - Avoid taking diuretics (hydrochlorothiazide, furosemide) in the evenings. - Avoid caffeine after 3pm. - Exercise or move your body regularly every day.  - You can also try melatonin 10 mg over the counter. Take this 1-2 hours before bed. You can increase to '20mg'$  if this is not helpful. - If you are lying in bed for 30 mins-1 hour and aren't falling asleep, get out of bed and do something relaxing like reading (NO TV!) until you are tired.  For smoking: Tobacco use is damaging to your body. It increases your risk of stroke, heart attack, lung cancer, and serious lung disease in the future. It also reduces your fertility.   Quitting tobacco is the best thing for your health but is a challenge---nicotine, a chemical in cigarettes, is highly addictive.   You can call 1 800 QUIT NOW (1-947-147-0401)---you will be  connected with a Artist. They can also mail you nicotine gums, lozenges, and patches to quit.   Ask me about patches (which you wear all day) and gums (which you use when you have a craving) to help you quit.   There are safe, effective medications to help you quit--  Varencline---also called Chantix---- is the most common medication used to help people stop smoking. It starts a low dose and is increased. I recommended choosing a quit date then starting the medication 8 days before this. Side effects include mild headache, difficulty sleeping, and odd dreams. The medication is typically very well tolerated.   Bupropion---also called Zyban---- is started 1 week before your quit date. You take 1 pill for three days then increase to 1 pill twice per day. Side effects include a mild headache and anxiety---this usually goes away. Some patients experience weight loss.     Thank you for choosing [facility].   Please call [phone] with any questions about today's appointment.  Please be sure to schedule follow up at the front  desk before you leave today.   Sharion Settler, DO PGY-2 Family Medicine

## 2020-12-19 NOTE — Progress Notes (Signed)
SUBJECTIVE:   CHIEF COMPLAINT / HPI:   High Blood Pressure Previously seen in the office in June and found to have elevated BP's. Goal BP <140/90. She was prescribed a blood pressure cuff and has been checking at home. Her readings range 120-130's/70-90's. She currently takes HCTZ 25 mg and Lisinopril 5 mg daily. She has a long-time smoking history.   Alcohol Use Disorder Patient drinks 6 beers/day. Has been drinking heavily for at least 40 years. Is not interested in cutting back.  She does not feel that she drinks excessively.  Feels that she could cut back in any time.  Denies any withdrawal symptoms or seizures in the past.  Smoking Cessation Smokes 1 ppd.  Has been smoking for about 40 years. Has cut back since retirement. Has bronchitis from the smoking, this bothers her some. Greater than 40 pack year history. Not interested in cutting back.  She feels she is not mentally there yet.  Difficulty Sleeping States that for the last 2 months she has had difficulty sleeping.  When she has a hard time sleeping she will typically turn the TV on to try to help her.  She goes to bed around 11 PM most nights.  She has tried melatonin in the past without relief.  Health Maintenance Given smoking history, needs low-dose CT of lungs.  She is s/p abdominal hysterectomy but still has cervical cuff. Due for Pap smear screening. Will need to schedule follow up for this. Due for Pneumococcal vaccine but declines. Due for Herpes Zoster and Tetanus vaccines, but declines. Declines COVID vaccine.   PERTINENT  PMH / PSH: Alcohol use disorder, Tobacco use disorder, HTN, alcohol-induced gastritis, colonic polyp  OBJECTIVE:   BP 127/69   Pulse 92   Ht '5\' 4"'$  (1.626 m)   Wt 162 lb 3.2 oz (73.6 kg)   SpO2 98%   BMI 27.84 kg/m    General: NAD, pleasant, able to participate in exam Cardiac: RRR, no murmurs. Respiratory: Expiratory phase about twice inspiratory phase, lungs clear bilaterally  without rhonchi/wheeze/rales Extremities: no edema or cyanosis. Skin: warm and dry, no rashes noted Neuro: alert, no obvious focal deficits Psych: Normal affect and mood  ASSESSMENT/PLAN:   Hypertension Reviewed patient's log.  She is mostly within goal of less than 140/90. -Continue current regimen with HCTZ and lisinopril  Alcohol use Fortunately, she has cut back significantly since her last appointment in June.  She still does not see a problem with her drinking history.  Discussed recommendation is no more than 2 drinks on a daily day for females.  Encouraged her to continue to cut back.  Tobacco use disorder Reports she has tried several different methods to help her quit in the past and has seen Dr. Valentina Lucks for this as well.  She was able to successfully quit for 4 months in the past.  She has not mentally in a place where she wants to quit at this time.  Gave patient information for tobacco cessation.  I would be glad to help her with this in the future, when she is ready.  I did discuss her elevated risk for complications with respiratory illnesses including pneumococcal and COVID.  She still denied vaccinations today.  Ordered low-dose CT to assess for lung cancer.  Will need to process this through insurance before scheduling.  Patient is aware.  Healthcare maintenance I encouraged pneumococcal, herpes zoster, tetanus, and COVID vaccinations today but patient refused all. Low-dose CT chest ordered as described  above for her smoking history. She is due for Pap smear for cervical cancer screening.  Patient reports partial hysterectomy in the past.  I reviewed records and it appears that she had a Pap smear performed in 2016, many years after her hysterectomy.  She still has a cervical cuff in place.  She will need an appointment for Pap smear screening.  Elevated alkaline phosphatase level Has been persistently elevated since 2019. Most recently it was 155 in June 2022.  Patient to  come back for lab only appointment tomorrow for fasting CMP and GGT to further assess.  Sleeping difficulties Discussed methods to improve sleep hygiene.  Her alcohol use is likely not helping this issue.  Prescription sent for 10 mg of Melatonin (likely only taking 3 mg previously). Gave handout for ways to help sleep hygiene including not watching TV prior to sleeping or when having difficulty sleeping.      Sabrina [name], [place]

## 2020-12-21 ENCOUNTER — Other Ambulatory Visit: Payer: Self-pay

## 2020-12-21 ENCOUNTER — Encounter: Payer: Self-pay | Admitting: Family Medicine

## 2020-12-21 ENCOUNTER — Ambulatory Visit (INDEPENDENT_AMBULATORY_CARE_PROVIDER_SITE_OTHER): Payer: 59 | Admitting: Family Medicine

## 2020-12-21 VITALS — BP 127/69 | HR 92 | Ht 64.0 in | Wt 162.2 lb

## 2020-12-21 DIAGNOSIS — Z122 Encounter for screening for malignant neoplasm of respiratory organs: Secondary | ICD-10-CM

## 2020-12-21 DIAGNOSIS — I10 Essential (primary) hypertension: Secondary | ICD-10-CM | POA: Diagnosis not present

## 2020-12-21 DIAGNOSIS — G479 Sleep disorder, unspecified: Secondary | ICD-10-CM

## 2020-12-21 DIAGNOSIS — R748 Abnormal levels of other serum enzymes: Secondary | ICD-10-CM

## 2020-12-21 DIAGNOSIS — Z0000 Encounter for general adult medical examination without abnormal findings: Secondary | ICD-10-CM

## 2020-12-21 DIAGNOSIS — F1090 Alcohol use, unspecified, uncomplicated: Secondary | ICD-10-CM

## 2020-12-21 DIAGNOSIS — F17200 Nicotine dependence, unspecified, uncomplicated: Secondary | ICD-10-CM

## 2020-12-21 DIAGNOSIS — Z7289 Other problems related to lifestyle: Secondary | ICD-10-CM

## 2020-12-21 DIAGNOSIS — Z789 Other specified health status: Secondary | ICD-10-CM

## 2020-12-21 MED ORDER — SIMETHICONE 80 MG PO CHEW
80.0000 mg | CHEWABLE_TABLET | Freq: Four times a day (QID) | ORAL | 0 refills | Status: DC | PRN
Start: 2020-12-21 — End: 2022-05-10

## 2020-12-21 MED ORDER — MELATONIN 10 MG PO CAPS: 10.0000 mg | ORAL_CAPSULE | Freq: Every evening | ORAL | 0 refills | Status: DC

## 2020-12-21 MED ORDER — HYDROCHLOROTHIAZIDE 25 MG PO TABS: ORAL_TABLET | ORAL | 0 refills | Status: DC

## 2020-12-21 NOTE — Assessment & Plan Note (Signed)
Has been persistently elevated since 2019. Most recently it was 155 in June 2022.  Patient to come back for lab only appointment tomorrow for fasting CMP and GGT to further assess.

## 2020-12-21 NOTE — Assessment & Plan Note (Addendum)
Discussed methods to improve sleep hygiene.  Her alcohol use is likely not helping this issue.  Prescription sent for 10 mg of Melatonin (likely only taking 3 mg previously). Gave handout for ways to help sleep hygiene including not watching TV prior to sleeping or when having difficulty sleeping.

## 2020-12-21 NOTE — Assessment & Plan Note (Signed)
Reports she has tried several different methods to help her quit in the past and has seen Dr. Valentina Lucks for this as well.  She was able to successfully quit for 4 months in the past.  She has not mentally in a place where she wants to quit at this time.  Gave patient information for tobacco cessation.  I would be glad to help her with this in the future, when she is ready.  I did discuss her elevated risk for complications with respiratory illnesses including pneumococcal and COVID.  She still denied vaccinations today.  Ordered low-dose CT to assess for lung cancer.  Will need to process this through insurance before scheduling.  Patient is aware.

## 2020-12-21 NOTE — Assessment & Plan Note (Signed)
I encouraged pneumococcal, herpes zoster, tetanus, and COVID vaccinations today but patient refused all. Low-dose CT chest ordered as described above for her smoking history. She is due for Pap smear for cervical cancer screening.  Patient reports partial hysterectomy in the past.  I reviewed records and it appears that she had a Pap smear performed in 2016, many years after her hysterectomy.  She still has a cervical cuff in place.  She will need an appointment for Pap smear screening.

## 2020-12-21 NOTE — Assessment & Plan Note (Signed)
Reviewed patient's log.  She is mostly within goal of less than 140/90. -Continue current regimen with HCTZ and lisinopril

## 2020-12-21 NOTE — Assessment & Plan Note (Signed)
Fortunately, she has cut back significantly since her last appointment in June.  She still does not see a problem with her drinking history.  Discussed recommendation is no more than 2 drinks on a daily day for females.  Encouraged her to continue to cut back.

## 2020-12-22 ENCOUNTER — Telehealth: Payer: Self-pay

## 2020-12-22 ENCOUNTER — Other Ambulatory Visit: Payer: Self-pay

## 2020-12-22 ENCOUNTER — Other Ambulatory Visit: Payer: 59

## 2020-12-22 DIAGNOSIS — R748 Abnormal levels of other serum enzymes: Secondary | ICD-10-CM

## 2020-12-22 NOTE — Telephone Encounter (Signed)
Addendum to previous note.  Patient is aware.  CT is on    01/11/2021  .Ozella Almond, CMA

## 2020-12-22 NOTE — Telephone Encounter (Signed)
Called patient and informed her of appointment.  CT Lung Chest scan 01/20/2021 [place]  Patient verbalized understanding.  Ozella Almond, [place]

## 2020-12-23 LAB — COMPREHENSIVE METABOLIC PANEL
ALT: 21 IU/L (ref 0–32)
AST: 21 IU/L (ref 0–40)
Albumin/Globulin Ratio: 1.3 (ref 1.2–2.2)
Albumin: 4.2 g/dL (ref 3.8–4.8)
Alkaline Phosphatase: 144 IU/L — ABNORMAL HIGH (ref 44–121)
BUN/Creatinine Ratio: 14 (ref 12–28)
BUN: 10 mg/dL (ref 8–27)
Bilirubin Total: 0.3 mg/dL (ref 0.0–1.2)
CO2: 22 mmol/L (ref 20–29)
Calcium: 10.3 mg/dL (ref 8.7–10.3)
Chloride: 100 mmol/L (ref 96–106)
Creatinine, Ser: 0.69 mg/dL (ref 0.57–1.00)
Globulin, Total: 3.3 g/dL (ref 1.5–4.5)
Glucose: 118 mg/dL — ABNORMAL HIGH (ref 65–99)
Potassium: 4.4 mmol/L (ref 3.5–5.2)
Sodium: 138 mmol/L (ref 134–144)
Total Protein: 7.5 g/dL (ref 6.0–8.5)
eGFR: 97 mL/min/{1.73_m2} (ref >59)

## 2020-12-23 LAB — GAMMA GT: GGT: 27 IU/L (ref 0–60)

## 2021-01-11 ENCOUNTER — Ambulatory Visit
Admission: RE | Admit: 2021-01-11 | Discharge: 2021-01-11 | Disposition: A | Discharge status: Home / Self Care | Payer: 59 | Source: Ambulatory Visit | Attending: Family Medicine | Admitting: Family Medicine

## 2021-01-11 ENCOUNTER — Other Ambulatory Visit: Payer: Self-pay

## 2021-01-11 DIAGNOSIS — Z122 Encounter for screening for malignant neoplasm of respiratory organs: Secondary | ICD-10-CM

## 2021-01-11 NOTE — Progress Notes (Signed)
    SUBJECTIVE:   CHIEF COMPLAINT / HPI:   Pap Smear Patient presents today for routine Pap smear for cervical cancer screening. She has history of partial hysterectomy.  Per chart review, appears that she had a Pap smear performed in 2016, many years after her hysterectomy.  This showed that she still has cervical cuff in place.  She has not had a another Pap smear since then. She denies any abnormal discharge and declines STI screening today.   Health Maintenance  Patient had low-dose CT for lung-cancer screening yesterday, 9/20. Results are not yet back but there appears to be a suspicious nodule in the right lung. Due for Shingrix, Flu, COVID and Tetanus vaccines. Last colonoscopy performed in August 2013- she had a sigmoid polyp removed that was hyperplastic. Next colonoscopy due in 2023.   Difficulties Sleeping Patient notes that she continues to have difficulty sleeping despite trying melatonin.  States that she feels restless at night and tosses and turns, has a hard time falling asleep.  At last appointment we also discussed sleep hygiene techniques.  She feels that this is not helping.  She has cut down on her alcohol intake and notes she does not drink at all during the week anymore.  PERTINENT  PMH / PSH:  Past Medical History:  Diagnosis Date   Allergy    Chronic bronchitis 1991   Hyperlipidemia    not on any current medication. Had been on lipitor in the past   Hypertension    Musculoskeletal chest pain      OBJECTIVE:   BP 122/80   Pulse 87   Ht 5\' 4"  (1.626 m)   Wt 163 lb 6.4 oz (74.1 kg)   SpO2 96%   BMI 28.05 kg/m    General: NAD, pleasant, able to participate in exam Cardiac: RRR Respiratory: CTAB, normal effort, No wheezes, rales or rhonchi Extremities: no edema or cyanosis. GU: Normal appearance of labia majora and minora, without lesions. Vagina tissue pink, dry, without lesions or abrasions. Cervix is absent. Skin: warm and dry, no rashes noted Neuro:  alert, no obvious focal deficits Psych: Normal affect and mood  GU exam chaperoned by CMA [facility]   ASSESSMENT/PLAN:   Healthcare maintenance Pap smear performed today.  No cervix visualized. Flu vaccine offered by patient declined.  Low-dose CT for lung cancer screening performed yesterday.  Final reading is pending, however, on my initial view there appears to be a suspicious nodule in the right lung that we will likely need biopsy. I discussed my concern with the patient and showed her the images. Shared decision making to await until results return prior to pulmonology referral.  Again, I encouraged smoking cessation.  Patient is not ready at this time.  I will be happy to help her with this in the future, when she is ready. -Will discuss Pap results with patient once they return. -F/u low-dose CT of lung; referral to Pulmonology as appropriate  Alcohol use Congratulated patient as she has made great strides to decrease her alcohol intake.  Note she is no longer drinking during the week.  Sleeping difficulties Continues to be a problem despite melatonin, decreased alcohol intake and improved sleep hygiene.  Will trial doxepin 6 mg tablets nightly.  Medication refill Refilled Breo Ellipta and albuterol inhalers today.     Sharion Settler, [place]

## 2021-01-12 ENCOUNTER — Ambulatory Visit (INDEPENDENT_AMBULATORY_CARE_PROVIDER_SITE_OTHER): Payer: 59 | Admitting: Family Medicine

## 2021-01-12 ENCOUNTER — Telehealth: Payer: Self-pay | Admitting: Family Medicine

## 2021-01-12 ENCOUNTER — Other Ambulatory Visit (HOSPITAL_COMMUNITY)
Admission: RE | Admit: 2021-01-12 | Discharge: 2021-01-12 | Disposition: A | Discharge status: Home / Self Care | Payer: 59 | Source: Ambulatory Visit | Attending: Family Medicine | Admitting: Family Medicine

## 2021-01-12 ENCOUNTER — Other Ambulatory Visit: Payer: Self-pay

## 2021-01-12 ENCOUNTER — Encounter: Payer: Self-pay | Admitting: Family Medicine

## 2021-01-12 VITALS — BP 122/80 | HR 87 | Ht 64.0 in | Wt 163.4 lb

## 2021-01-12 DIAGNOSIS — Z01419 Encounter for gynecological examination (general) (routine) without abnormal findings: Secondary | ICD-10-CM | POA: Diagnosis not present

## 2021-01-12 DIAGNOSIS — Z760 Encounter for issue of repeat prescription: Secondary | ICD-10-CM

## 2021-01-12 DIAGNOSIS — Z0000 Encounter for general adult medical examination without abnormal findings: Secondary | ICD-10-CM | POA: Diagnosis not present

## 2021-01-12 DIAGNOSIS — Z7289 Other problems related to lifestyle: Secondary | ICD-10-CM | POA: Diagnosis not present

## 2021-01-12 DIAGNOSIS — Z789 Other specified health status: Secondary | ICD-10-CM

## 2021-01-12 DIAGNOSIS — G479 Sleep disorder, unspecified: Secondary | ICD-10-CM

## 2021-01-12 DIAGNOSIS — F1090 Alcohol use, unspecified, uncomplicated: Secondary | ICD-10-CM

## 2021-01-12 MED ORDER — DOXEPIN HCL 6 MG PO TABS: 6.0000 mg | ORAL_TABLET | Freq: Every evening | ORAL | 1 refills | Status: DC

## 2021-01-12 MED ORDER — FLUTICASONE FUROATE-VILANTEROL 200-25 MCG/INH IN AEPB: 1.0000 | INHALATION_SPRAY | Freq: Every day | RESPIRATORY_TRACT | 0 refills | Status: DC

## 2021-01-12 MED ORDER — ALBUTEROL SULFATE HFA 108 (90 BASE) MCG/ACT IN AERS: INHALATION_SPRAY | RESPIRATORY_TRACT | 1 refills | Status: DC

## 2021-01-12 NOTE — Assessment & Plan Note (Signed)
Congratulated patient as she has made great strides to decrease her alcohol intake.  Note she is no longer drinking during the week.

## 2021-01-12 NOTE — Telephone Encounter (Signed)
Discussed with radiologist, Dr. Rosario Jacks, regarding patient's CT. This was solely for education purposes to get a better understanding of what I thought may have been a suspicious nodule. She states the area of my concern was just scarring in the fissures. Patient is Lung-RADS 1 and should have next screen in 1 year. Will discuss with patient and hold off on any Pulmonology referral at this time.

## 2021-01-12 NOTE — Patient Instructions (Addendum)
It was wonderful to see you today.  Please bring ALL of your medications with you to every visit.   Today we talked about:  -I did a Pap smear. This is likely the last one you will need to have.  I will contact you with results. -I am still waiting on the final reading for the CT of your lungs.  I will contact you with the results and with the next steps. -I am sending prescription for doxepin that you can use to help you sleep.  You can take 1 tablet nightly.  Please come back and see me if this still does not help.  Thank you for choosing [facility].   Please call [phone] with any questions about today's appointment.  Please be sure to schedule follow up at the front  desk before you leave today.   Sharion Settler, DO PGY-2 Family Medicine

## 2021-01-12 NOTE — Assessment & Plan Note (Signed)
Refilled Breo Ellipta and albuterol inhalers today.

## 2021-01-12 NOTE — Assessment & Plan Note (Addendum)
Pap smear performed today.  No cervix visualized. Flu vaccine offered by patient declined.  Low-dose CT for lung cancer screening performed yesterday.  Final reading is pending, however, on my initial view there appears to be a suspicious nodule in the right lung that we will likely need biopsy. I discussed my concern with the patient and showed her the images. Shared decision making to await until results return prior to pulmonology referral.  Again, I encouraged smoking cessation.  Patient is not ready at this time.  I will be happy to help her with this in the future, when she is ready. -Will discuss Pap results with patient once they return. -F/u low-dose CT of lung; referral to Pulmonology as appropriate

## 2021-01-12 NOTE — Assessment & Plan Note (Signed)
Continues to be a problem despite melatonin, decreased alcohol intake and improved sleep hygiene.  Will trial doxepin 6 mg tablets nightly.

## 2021-01-13 LAB — CYTOLOGY - PAP
Adequacy: ABSENT
Comment: NEGATIVE
Diagnosis: NEGATIVE
High risk HPV: NEGATIVE

## 2021-01-25 ENCOUNTER — Telehealth: Payer: Self-pay

## 2021-01-25 NOTE — Telephone Encounter (Signed)
Received fax from pharmacy, PA needed on Doxepin.  Clinical questions submitted via Cover My Meds.  Waiting on response, could take up to 72 hours.  Cover My Meds info: Key: [id number]  Talbot Grumbling, RN

## 2021-01-31 ENCOUNTER — Other Ambulatory Visit: Payer: Self-pay | Admitting: Family Medicine

## 2021-01-31 DIAGNOSIS — I10 Essential (primary) hypertension: Secondary | ICD-10-CM

## 2021-01-31 NOTE — Telephone Encounter (Signed)
Received fax from insurance company regarding PA.   Has the patient had a previous trial of two preferred step therapy agents: Temazepam, Zaleplon, Zolpidem, or eszopiclone?  If no, is there a medical reason why the patient cannot try the step therapy drugs offered.    Please advise.   Talbot Grumbling, RN

## 2021-02-04 ENCOUNTER — Other Ambulatory Visit: Payer: Self-pay | Admitting: Family Medicine

## 2021-02-04 MED ORDER — TRAZODONE HCL 50 MG PO TABS: 50.0000 mg | ORAL_TABLET | Freq: Every day | ORAL | 1 refills | Status: DC

## 2021-02-04 NOTE — Progress Notes (Signed)
Sending Rx for Trazodone to use nightly for sleep.

## 2021-02-11 ENCOUNTER — Other Ambulatory Visit: Payer: Self-pay | Admitting: Family Medicine

## 2021-06-16 ENCOUNTER — Other Ambulatory Visit: Payer: Self-pay | Admitting: Family Medicine

## 2021-06-22 ENCOUNTER — Other Ambulatory Visit: Payer: Self-pay | Admitting: Family Medicine

## 2021-06-22 ENCOUNTER — Encounter: Payer: Self-pay | Admitting: Family Medicine

## 2021-06-22 DIAGNOSIS — R748 Abnormal levels of other serum enzymes: Secondary | ICD-10-CM

## 2021-08-29 NOTE — Progress Notes (Addendum)
SUBJECTIVE:   Chief compliant/HPI: annual examination  Sabrina [name] is a 66 y.o. who presents today for an annual exam.  Current concerns: None  History tabs reviewed and updated.   Review of systems form reviewed and negative for unintentional weight loss, bloody stools, SOB.   OBJECTIVE:   BP 127/73   Pulse (!) 110   Wt 160 lb (72.6 kg)   SpO2 94%   BMI 27.46 kg/m   Blood pressure 127/73, pulse 90, weight 160 lb (72.6 kg), SpO2 94 %.  General: Awake, alert, oriented, in no acute distress, pleasant and cooperative with examination, smells of tobacco HEENT: Normocephalic, atraumatic, nares patent, dentition is poor with several missing teeth, oropharynx without erythema or exudates, TM's clear bilaterally, no thyroid nodules palpated Cardio: RRR without murmur, 2+ radial, DP and PT pulses b/l Respiratory: CTAB without wheezing/rhonchi/rales Abdomen: Soft, non-tender to palpation of all quadrants, non-distended, no rebound/guarding MSK: Able to move all extremities spontaneously, good muscle strength, no abnormalities Extremities: without edema or cyanosis Neuro: Speech is clear and intact, no focal deficits, no facial asymmetry, follows commands  Psych: Normal mood and affect     09/01/2021    3:02 PM 12/21/2020    3:54 PM 09/29/2020   11:39 AM  Depression screen PHQ 2/9  Decreased Interest _0 Down, Depressed, Hopeless 0 0 0  PHQ - 2 Score _1 Altered sleeping _2 Tired, decreased energy _3 Change in appetite 3 1 0  Feeling bad or failure about yourself  0 0 0  Trouble concentrating 3 0 0  Moving slowly or fidgety/restless  0 0  Suicidal thoughts 0 0 0  PHQ-9 Score _4 Difficult doing work/chores Not difficult at all Not difficult at all     ASSESSMENT/PLAN:   Chronic Bronchitis Refilled Breo Ellipta. Encouraged cessation, patient is not ready at this time.   Elevated Alkaline Phosphatase History of elevated alkaline phosphatase. Last Alk  phos 144 in August (highest was 155 in June). Will recheck on CMP today.   Tobacco Use Disorder Education provided today on various methods to assist with cessation. Patient provided with smoking cessation  hotline and encouraged to schedule appointment with Dr. Valentina Lucks for further assistance but she is in the pre-contemplative state.  Hypertension BP Normotensive today.  -Continue Lisinopril 5 mg -Continue HCTZ 25 mg   Annual Examination  See AVS for age appropriate recommendations  PHQ score 15, reviewed and discussed. States she just feels tired, not feeling depressed. Has good support system. BP reviewed and at goal.  Advance directives discussion, provided with paperwork.  Considered the following items based upon USPSTF recommendations: Diabetes screening:  not ordered Screening for elevated cholesterol: discussed and ordered HIV testing: discussed and declined Hepatitis C: discussed and declined Hepatitis B: discussed and declined Syphilis if at high risk: discussed and declined GC/CT not at high risk and not ordered. Osteoporosis screening considered based upon risk of fracture from [facility] calculator. Major osteoporotic fracture risk is 8.9%. DEXA not ordered.  Reviewed risk factors for latent tuberculosis and not indicated  Cervical cancer screening:  Last pap reviewed and normal in 09/2020 Breast cancer screening:  up to date, next due 09/2022 Colorectal cancer screening:  due for repeat colonoscopy 11/2021. Will place referral today. Lung cancer screening:  Due for repeat CT chest in September . Vaccinations: COVID vaccine and Tdap offered today and declined.   Follow up in  1 year or sooner if indicated.    Sharion Settler, [place]

## 2021-09-01 ENCOUNTER — Other Ambulatory Visit: Payer: Self-pay

## 2021-09-01 ENCOUNTER — Ambulatory Visit (INDEPENDENT_AMBULATORY_CARE_PROVIDER_SITE_OTHER): Payer: 59 | Admitting: Family Medicine

## 2021-09-01 ENCOUNTER — Encounter: Payer: Self-pay | Admitting: Family Medicine

## 2021-09-01 ENCOUNTER — Other Ambulatory Visit: Payer: Self-pay | Admitting: Family Medicine

## 2021-09-01 VITALS — BP 127/73 | HR 90 | Wt 160.0 lb

## 2021-09-01 DIAGNOSIS — E785 Hyperlipidemia, unspecified: Secondary | ICD-10-CM

## 2021-09-01 DIAGNOSIS — R748 Abnormal levels of other serum enzymes: Secondary | ICD-10-CM | POA: Diagnosis not present

## 2021-09-01 DIAGNOSIS — Z1231 Encounter for screening mammogram for malignant neoplasm of breast: Secondary | ICD-10-CM

## 2021-09-01 DIAGNOSIS — Z23 Encounter for immunization: Secondary | ICD-10-CM

## 2021-09-01 DIAGNOSIS — Z0000 Encounter for general adult medical examination without abnormal findings: Secondary | ICD-10-CM

## 2021-09-01 DIAGNOSIS — J42 Unspecified chronic bronchitis: Secondary | ICD-10-CM

## 2021-09-01 DIAGNOSIS — Z1211 Encounter for screening for malignant neoplasm of colon: Secondary | ICD-10-CM

## 2021-09-01 MED ORDER — FLUTICASONE FUROATE-VILANTEROL 200-25 MCG/ACT IN AEPB: 1.0000 | INHALATION_SPRAY | Freq: Every day | RESPIRATORY_TRACT | 3 refills | Status: AC

## 2021-09-01 NOTE — Assessment & Plan Note (Signed)
Breo ellipta refilled today. Otherwise stable. Encouraged smoking cessation, patient is not interested at this time. ?

## 2021-09-01 NOTE — Patient Instructions (Signed)
It was wonderful to see you today. ? ?Please bring ALL of your medications with you to every visit.  ? ?Today we talked about: ? ?We are doing lab work today to check your cholesterol, kidneys and electrolytes. I will send you a MyChart message if you have MyChart. Otherwise, I will give you a call for abnormal results or send a letter if everything returned back normal. If you don't hear from me in 2 weeks, please call the office.   ? ?Today at your annual preventive visit we talked about the following measures: ?I recommend 150 minutes of exercise per week-try 30 minutes 5 days per week ?We discussed reducing sugary beverages (like soda and juice) and increasing leafy greens and whole fruits.  ?We discussed avoiding tobacco and alcohol.  ?I recommend avoiding illicit substances.  ?Your blood pressure is at goal of.  ? ?Tobacco use is damaging to your body. It increases your risk of stroke, heart attack, lung cancer, and serious lung disease in the future. It also reduces your fertility.  ? ?Quitting tobacco is the best thing for your health but is a challenge---nicotine, a chemical in cigarettes, is highly addictive.  ? ?You can call 1 800 QUIT NOW (1-2293391777)---you will be connected with a Artist. They can also mail you nicotine gums, lozenges, and patches to quit.  ? ?Ask me about patches (which you wear all day) and gums (which you use when you have a craving) to help you quit.  ? ?There are safe, effective medications to help you quit-- ? ?Varencline---also called Chantix---- is the most common medication used to help people stop smoking. It starts a low dose and is increased. I recommended choosing a quit date then starting the medication 8 days before this. Side effects include mild headache, difficulty sleeping, and odd dreams. The medication is typically very well tolerated.  ? ?Bupropion---also called Zyban---- is started 1 week before your quit date. You take 1 pill for three days then  increase to 1 pill twice per day. Side effects include a mild headache and anxiety---this usually goes away. Some patients experience weight loss.   ? ? ? ?Thank you for choosing [facility].  ? ?Please call [phone] with any questions about today's appointment. ? ?Please be sure to schedule follow up at the front  desk before you leave today.  ? ?Sabrina Settler, DO ?PGY-2 Family Medicine   ?

## 2021-09-02 ENCOUNTER — Other Ambulatory Visit: Payer: Self-pay | Admitting: Family Medicine

## 2021-09-02 DIAGNOSIS — R748 Abnormal levels of other serum enzymes: Secondary | ICD-10-CM

## 2021-09-02 LAB — COMPREHENSIVE METABOLIC PANEL
ALT: 26 IU/L (ref 0–32)
AST: 26 IU/L (ref 0–40)
Albumin/Globulin Ratio: 1.2 (ref 1.2–2.2)
Albumin: 4.2 g/dL (ref 3.8–4.8)
Alkaline Phosphatase: 139 IU/L — ABNORMAL HIGH (ref 44–121)
BUN/Creatinine Ratio: 14 (ref 12–28)
BUN: 12 mg/dL (ref 8–27)
Bilirubin Total: <0.2 mg/dL (ref 0.0–1.2)
CO2: 21 mmol/L (ref 20–29)
Calcium: 10 mg/dL (ref 8.7–10.3)
Chloride: 98 mmol/L (ref 96–106)
Creatinine, Ser: 0.85 mg/dL (ref 0.57–1.00)
Globulin, Total: 3.4 g/dL (ref 1.5–4.5)
Glucose: 115 mg/dL — ABNORMAL HIGH (ref 70–99)
Potassium: 3.7 mmol/L (ref 3.5–5.2)
Sodium: 135 mmol/L (ref 134–144)
Total Protein: 7.6 g/dL (ref 6.0–8.5)
eGFR: 76 mL/min/{1.73_m2} (ref >59)

## 2021-09-02 LAB — LIPID PANEL
Chol/HDL Ratio: 5.5 ratio — ABNORMAL HIGH (ref 0.0–4.4)
Cholesterol, Total: 160 mg/dL (ref 100–199)
HDL: 29 mg/dL — ABNORMAL LOW (ref >39)
LDL Chol Calc (NIH): 80 mg/dL (ref 0–99)
Triglycerides: 312 mg/dL — ABNORMAL HIGH (ref 0–149)
VLDL Cholesterol Cal: 51 mg/dL — ABNORMAL HIGH (ref 5–40)

## 2021-09-02 NOTE — Progress Notes (Signed)
Placing orders to add on phosphorus and vitamin D given persistently elevated alkaline phosphatase.  Had previously normal GGT. ?

## 2021-09-05 LAB — SPECIMEN STATUS REPORT

## 2021-09-05 LAB — VITAMIN D 25 HYDROXY (VIT D DEFICIENCY, FRACTURES): Vit D, 25-Hydroxy: 6.8 ng/mL — ABNORMAL LOW (ref 30.0–100.0)

## 2021-09-06 ENCOUNTER — Other Ambulatory Visit: Payer: Self-pay | Admitting: Family Medicine

## 2021-09-06 DIAGNOSIS — E559 Vitamin D deficiency, unspecified: Secondary | ICD-10-CM

## 2021-09-06 MED ORDER — VITAMIN D3 20 MCG (800 UNIT) PO TABS: 1.0000 | ORAL_TABLET | Freq: Every day | ORAL | 1 refills | Status: AC

## 2021-09-06 MED ORDER — VITAMIN D (ERGOCALCIFEROL) 1.25 MG (50000 UNIT) PO CAPS: 50000.0000 [IU] | ORAL_CAPSULE | ORAL | 0 refills | Status: AC

## 2021-09-14 ENCOUNTER — Telehealth: Payer: Self-pay | Admitting: *Deleted

## 2021-09-14 NOTE — Telephone Encounter (Signed)
Patient left a message on the referral line.  She would like to be referred back to [facility] for her colonoscopy.  She is due for her screening.  Joi Leyva,CMA

## 2021-09-21 ENCOUNTER — Other Ambulatory Visit: Payer: Self-pay | Admitting: Family Medicine

## 2021-09-27 ENCOUNTER — Encounter: Payer: Self-pay | Admitting: *Deleted

## 2021-12-06 ENCOUNTER — Encounter: Payer: Self-pay | Admitting: Ophthalmology

## 2021-12-09 NOTE — Discharge Instructions (Signed)

## 2021-12-13 ENCOUNTER — Ambulatory Visit (AMBULATORY_SURGERY_CENTER): Payer: 59 | Admitting: Anesthesiology

## 2021-12-13 ENCOUNTER — Encounter: Payer: Self-pay | Admitting: Ophthalmology

## 2021-12-13 ENCOUNTER — Ambulatory Visit
Admission: RE | Admit: 2021-12-13 | Discharge: 2021-12-13 | Disposition: A | Discharge status: Home / Self Care | Payer: 59 | Attending: Ophthalmology | Admitting: Ophthalmology

## 2021-12-13 ENCOUNTER — Ambulatory Visit: Payer: 59 | Admitting: Anesthesiology

## 2021-12-13 ENCOUNTER — Other Ambulatory Visit: Payer: Self-pay

## 2021-12-13 ENCOUNTER — Encounter
Admission: RE | Disposition: A | Discharge status: Home / Self Care | Payer: Self-pay | Source: Home / Self Care | Attending: Ophthalmology

## 2021-12-13 DIAGNOSIS — I10 Essential (primary) hypertension: Secondary | ICD-10-CM | POA: Diagnosis not present

## 2021-12-13 DIAGNOSIS — F17210 Nicotine dependence, cigarettes, uncomplicated: Secondary | ICD-10-CM | POA: Insufficient documentation

## 2021-12-13 DIAGNOSIS — Z79899 Other long term (current) drug therapy: Secondary | ICD-10-CM | POA: Insufficient documentation

## 2021-12-13 DIAGNOSIS — J42 Unspecified chronic bronchitis: Secondary | ICD-10-CM | POA: Diagnosis not present

## 2021-12-13 DIAGNOSIS — H2511 Age-related nuclear cataract, right eye: Secondary | ICD-10-CM | POA: Insufficient documentation

## 2021-12-13 DIAGNOSIS — J45909 Unspecified asthma, uncomplicated: Secondary | ICD-10-CM

## 2021-12-13 DIAGNOSIS — K219 Gastro-esophageal reflux disease without esophagitis: Secondary | ICD-10-CM | POA: Insufficient documentation

## 2021-12-13 DIAGNOSIS — Z7951 Long term (current) use of inhaled steroids: Secondary | ICD-10-CM | POA: Insufficient documentation

## 2021-12-13 DIAGNOSIS — F17200 Nicotine dependence, unspecified, uncomplicated: Secondary | ICD-10-CM | POA: Diagnosis not present

## 2021-12-13 HISTORY — PX: CATARACT EXTRACTION W/PHACO: SHX586

## 2021-12-13 HISTORY — DX: Gastro-esophageal reflux disease without esophagitis: K21.9

## 2021-12-13 SURGERY — PHACOEMULSIFICATION, CATARACT, WITH IOL INSERTION
Anesthesia: Monitor Anesthesia Care | Site: Eye | Laterality: Right

## 2021-12-13 MED ORDER — SIGHTPATH DOSE#1 BSS IO SOLN
INTRAOCULAR | Status: DC | PRN
  Administered 2021-12-13: 15 mL

## 2021-12-13 MED ORDER — FENTANYL CITRATE (PF) 100 MCG/2ML IJ SOLN
INTRAMUSCULAR | Status: DC | PRN
  Administered 2021-12-13 (×2): 50 ug via INTRAVENOUS

## 2021-12-13 MED ORDER — TRYPAN BLUE 0.06 % IO SOSY
PREFILLED_SYRINGE | INTRAOCULAR | Status: DC | PRN
  Administered 2021-12-13: 0.5 mL via INTRAOCULAR

## 2021-12-13 MED ORDER — SIGHTPATH DOSE#1 NA CHONDROIT SULF-NA HYALURON 40-17 MG/ML IO SOLN
INTRAOCULAR | Status: DC | PRN
  Administered 2021-12-13: 1 mL via INTRAOCULAR

## 2021-12-13 MED ORDER — LACTATED RINGERS IV SOLN: INTRAVENOUS | Status: DC

## 2021-12-13 MED ORDER — BRIMONIDINE TARTRATE-TIMOLOL 0.2-0.5 % OP SOLN
OPHTHALMIC | Status: DC | PRN
  Administered 2021-12-13: 1 [drp] via OPHTHALMIC

## 2021-12-13 MED ORDER — MIDAZOLAM HCL 2 MG/2ML IJ SOLN
INTRAMUSCULAR | Status: DC | PRN
  Administered 2021-12-13: 2 mg via INTRAVENOUS

## 2021-12-13 MED ORDER — MOXIFLOXACIN HCL 0.5 % OP SOLN
OPHTHALMIC | Status: DC | PRN
  Administered 2021-12-13: 0.2 mL via OPHTHALMIC

## 2021-12-13 MED ORDER — SIGHTPATH DOSE#1 BSS IO SOLN
INTRAOCULAR | Status: DC | PRN
  Administered 2021-12-13: 1 mL

## 2021-12-13 MED ORDER — ARMC OPHTHALMIC DILATING DROPS
1.0000 | OPHTHALMIC | Status: DC | PRN
  Administered 2021-12-13 (×3): 1 via OPHTHALMIC

## 2021-12-13 MED ORDER — TETRACAINE HCL 0.5 % OP SOLN
1.0000 [drp] | OPHTHALMIC | Status: DC | PRN
  Administered 2021-12-13 (×3): 1 [drp] via OPHTHALMIC

## 2021-12-13 MED ORDER — SIGHTPATH DOSE#1 BSS IO SOLN
INTRAOCULAR | Status: DC | PRN
  Administered 2021-12-13: 54 mL via OPHTHALMIC

## 2021-12-13 SURGICAL SUPPLY — 16 items
CANNULA ANT/CHMB 27G (MISCELLANEOUS) IMPLANT
CANNULA ANT/CHMB 27GA (MISCELLANEOUS) IMPLANT
CATARACT SUITE SIGHTPATH (MISCELLANEOUS) ×1 IMPLANT
FEE CATARACT SUITE SIGHTPATH (MISCELLANEOUS) ×1 IMPLANT
GLOVE SURG ENC TEXT LTX SZ8 (GLOVE) ×1 IMPLANT
GLOVE SURG TRIUMPH 8.0 PF LTX (GLOVE) ×1 IMPLANT
LENS IOL TECNIS EYHANCE 20.5 (Intraocular Lens) IMPLANT
NDL FILTER BLUNT 18X1 1/2 (NEEDLE) ×1 IMPLANT
NEEDLE FILTER BLUNT 18X 1/2SAF (NEEDLE) ×1
NEEDLE FILTER BLUNT 18X1 1/2 (NEEDLE) ×1 IMPLANT
PACK VIT ANT 23G (MISCELLANEOUS) IMPLANT
RING MALYGIN (MISCELLANEOUS) IMPLANT
SUT ETHILON 10-0 CS-B-6CS-B-6 (SUTURE)
SUTURE EHLN 10-0 CS-B-6CS-B-6 (SUTURE) IMPLANT
SYR 3ML LL SCALE MARK (SYRINGE) ×1 IMPLANT
WATER STERILE IRR 250ML POUR (IV SOLUTION) ×1 IMPLANT

## 2021-12-13 NOTE — Anesthesia Preprocedure Evaluation (Signed)
Anesthesia Evaluation  Patient identified by MRN, date of birth, ID band Patient awake    Reviewed: Allergy & Precautions, NPO status , Patient's Chart, lab work & pertinent test results  History of Anesthesia Complications Negative for: history of anesthetic complications  Airway Mallampati: IV  TM Distance: >3 FB Neck ROM: full    Dental  (+) Poor Dentition, Missing, Loose   Pulmonary asthma , Current Smoker,    Pulmonary exam normal        Cardiovascular hypertension, (-) CABG negative cardio ROS Normal cardiovascular exam(-) dysrhythmias      Neuro/Psych negative neurological ROS  negative psych ROS   GI/Hepatic negative GI ROS, Neg liver ROS,   Endo/Other  negative endocrine ROS  Renal/GU      Musculoskeletal   Abdominal   Peds  Hematology negative hematology ROS (+)   Anesthesia Other Findings Past Medical History: No date: Allergy 1991: Chronic bronchitis No date: GERD (gastroesophageal reflux disease) No date: Hyperlipidemia     Comment:  not on any current medication. 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 No date: CHOLECYSTECTOMY     Comment:  1979  BMI    Body Mass Index: 26.43 kg/m      Reproductive/Obstetrics negative OB ROS                             Anesthesia Physical Anesthesia Plan  ASA: 2  Anesthesia Plan: MAC   Post-op Pain Management:    Induction: Intravenous  PONV Risk Score and Plan: 2  Airway Management Planned: Natural Airway and Nasal Cannula  Additional Equipment:   Intra-op Plan:   Post-operative Plan:   Informed Consent: I have reviewed the patients History and Physical, chart, labs and discussed the procedure including  the risks, benefits and alternatives for the proposed anesthesia with the patient or authorized representative who has indicated his/her understanding and acceptance.     Dental Advisory Given  Plan Discussed with: Anesthesiologist, CRNA and Surgeon  Anesthesia Plan Comments: (Patient consented for risks of anesthesia including but not limited to:  - adverse reactions to medications - damage to eyes, teeth, lips or other oral mucosa - nerve damage due to positioning  - sore throat or hoarseness - Damage to heart, brain, nerves, lungs, other parts of body or loss of life  Patient voiced understanding.)        Anesthesia Quick Evaluation

## 2021-12-13 NOTE — H&P (Signed)
[name]   Primary Care Physician:  Sharion Settler, DO Ophthalmologist: Dr. George Ina  Pre-Procedure History & Physical: HPI:  Sabrina [name] is a 66 y.o. female here for cataract surgery.   Past Medical History:  Diagnosis Date   Allergy    Chronic bronchitis 1991   GERD (gastroesophageal reflux disease)    Hyperlipidemia    not on any current medication. 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 Authorizing Provider  albuterol (VENTOLIN HFA) 108 (90 Base) MCG/ACT inhaler INHALE 2 PUFFS BY MOUTH INTO THE LUNGS AS NEEDED FOR WHEEZING OR SHORTNESS OF BREATH 01/12/21  Yes Espinoza, Alejandra, DO  aspirin-sod bicarb-citric acid (ALKA-SELTZER) 325 MG TBEF tablet Take 325 mg by mouth every 6 (six) hours as needed.   Yes [provider]  Cholecalciferol (VITAMIN D3) 20 MCG (800 UNIT) TABS Take 1 tablet by mouth daily. Start after you complete 8 doses (8 weeks) of the 50,000 unit capsules of Vitamin D 09/06/21  Yes Espinoza, Alejandra, DO  dextromethorphan-guaiFENesin (MUCINEX DM) 30-600 MG 12hr tablet Take 1 tablet by mouth 2 (two) times daily as needed for cough.   Yes [provider]  fluticasone furoate-vilanterol (BREO ELLIPTA) 200-25 MCG/ACT AEPB Inhale 1 puff into the lungs daily. 09/01/21  Yes Sharion Settler, DO  hydrochlorothiazide (HYDRODIURIL) 25 MG tablet TAKE 1 TABLET(25 MG) BY MOUTH DAILY 01/31/21  Yes Espinoza, Alejandra, DO  lisinopril (ZESTRIL) 5 MG tablet TAKE 1 TABLET(5 MG) BY MOUTH DAILY 01/31/21  Yes Espinoza, Alejandra, DO  pravastatin (PRAVACHOL) 40 MG tablet TAKE 1 TABLET(40 MG) BY MOUTH DAILY 02/11/21  Yes Espinoza, Alejandra, DO  simethicone  (GAS-X) 80 MG chewable tablet Chew 1 tablet (80 mg total) by mouth every 6 (six) hours as needed for flatulence. 12/21/20  Yes Sharion Settler, DO  traZODone (DESYREL) 50 MG tablet TAKE 1 TABLET(50 MG) BY MOUTH AT BEDTIME 09/21/21  Yes Sharion Settler, DO  Blood Pressure Monitoring (BLOOD PRESSURE MONITOR AUTOMAT) DEVI Check blood pressure once daily Patient not taking: Reported on 01/12/2021 11/08/20   Lind Covert, MD    Allergies as of 10/18/2021   (No Known Allergies)    Family History  Problem Relation Age of Onset   Sickle cell anemia Other    Alzheimer's disease Mother    Hyperlipidemia Mother    Hypertension Mother    Emphysema Father    Alcohol abuse Sister    Breast cancer Sister    Asthma Brother    Cervical cancer Daughter     Social History   Socioeconomic History   Marital status: Divorced    Spouse name: Not on file   Number of children: Not on file   Years of education: Not on file   Highest education level: Not on file  Occupational History   Not on file  Tobacco Use   Smoking status: Every Day    Years: 45.00    Types: Cigarettes   Smokeless tobacco: Never   Tobacco comments:    Started between age 44 and 26  Vaping Use   Vaping Use: Never used  Substance and Sexual Activity   Alcohol use:  Yes    Alcohol/week: 9.0 standard drinks of alcohol    Types: 6 Cans of beer, 3 Shots of liquor per week   Drug use: No   Sexual activity: Not on file  Other Topics Concern   Not on file  Social History Narrative   Not on file   Social Determinants of Health   Financial Resource Strain: Not on file  Food Insecurity: Not on file  Transportation Needs: Not on file  Physical Activity: Not on file  Stress: Not on file  Social Connections: Not on file  Intimate Partner Violence: Not on file    Review of Systems: See HPI, otherwise negative ROS  Physical Exam: BP 129/63   Pulse 68   Temp (!) 97.2 F (36.2 C) (Temporal)   Resp 18   Ht  '5\' 4"'$  (1.626 m)   Wt 69.9 kg   SpO2 96%   BMI 26.43 kg/m  General:   Alert, cooperative in NAD Head:  Normocephalic and atraumatic. Respiratory:  Normal work of breathing. Cardiovascular:  RRR  Impression/Plan: Sabrina [name] is here for cataract surgery.  Risks, benefits, limitations, and alternatives regarding cataract surgery have been reviewed with the patient.  Questions have been answered.  All parties agreeable.   Birder Robson, MD  12/13/2021, 11:39 AM

## 2021-12-13 NOTE — Anesthesia Postprocedure Evaluation (Signed)
Anesthesia Post Note  Patient: Sabrina [name]  Procedure(s) Performed: CATARACT EXTRACTION PHACO AND INTRAOCULAR LENS PLACEMENT (I[facility]) RIGHT VISION BLUE (Right: Eye)     Patient location during evaluation: PACU Anesthesia Type: MAC Level of consciousness: awake and alert Pain management: pain level controlled Vital Signs Assessment: post-procedure vital signs reviewed and stable Respiratory status: spontaneous breathing, nonlabored ventilation, respiratory function stable and patient connected to nasal cannula oxygen Cardiovascular status: stable and blood pressure returned to baseline Postop Assessment: no apparent nausea or vomiting Anesthetic complications: no   No notable events documented.  Dimas Millin

## 2021-12-13 NOTE — Transfer of Care (Signed)
Immediate Anesthesia Transfer of Care Note  Patient: Sabrina [name]  Procedure(s) Performed: CATARACT EXTRACTION PHACO AND INTRAOCULAR LENS PLACEMENT ([facility]) RIGHT VISION BLUE (Right: Eye)  Patient Location: PACU  Anesthesia Type: MAC  Level of Consciousness: awake, alert  and patient cooperative  Airway and Oxygen Therapy: Patient Spontanous Breathing   Post-op Assessment: Post-op Vital signs reviewed, Patient's Cardiovascular Status Stable, Respiratory Function Stable, Patent Airway and No signs of Nausea or vomiting  Post-op Vital Signs: Reviewed and stable  Complications: No notable events documented.

## 2021-12-13 NOTE — Op Note (Signed)
PREOPERATIVE DIAGNOSIS:  Nuclear sclerotic cataract of the right eye.   POSTOPERATIVE DIAGNOSIS:  Cataract   OPERATIVE PROCEDURE:ORPROCALL@   SURGEON:  Birder Robson, MD.   ANESTHESIA:  Anesthesiologist: Dimas Millin, MD CRNA: Janna Arch, CRNA  1.      Managed anesthesia care. 2.      0.64m of Shugarcaine was instilled in the eye following the paracentesis.   COMPLICATIONS: Vision Blue was used to stain the anterior capsule due to very poor/ no visualization of the red reflex.    TECHNIQUE:   Stop and chop   DESCRIPTION OF PROCEDURE:  The patient was examined and consented in the preoperative holding area where the aforementioned topical anesthesia was applied to the right eye and then brought back to the Operating Room where the right eye was prepped and draped in the usual sterile ophthalmic fashion and a lid speculum was placed. A paracentesis was created with the side port blade and the anterior chamber was filled with viscoelastic. A near clear corneal incision was performed with the steel keratome. A continuous curvilinear capsulorrhexis was performed with a cystotome followed by the capsulorrhexis forceps. Hydrodissection and hydrodelineation were carried out with BSS on a blunt cannula. The lens was removed in a stop and chop  technique and the remaining cortical material was removed with the irrigation-aspiration handpiece. The capsular bag was inflated with viscoelastic and the Technis ZCB00  lens was placed in the capsular bag without complication. The remaining viscoelastic was removed from the eye with the irrigation-aspiration handpiece. The wounds were hydrated. The anterior chamber was flushed with BSS and the eye was inflated to physiologic pressure. 0.116mof Vigamox was placed in the anterior chamber. The wounds were found to be water tight. The eye was dressed with Combigan. The patient was given protective glasses to wear throughout the day and a shield with  which to sleep tonight. The patient was also given drops with which to begin a drop regimen today and will follow-up with me in one day. Implant Name Type Inv. Item Serial No. Manufacturer Lot No. LRB No. Used Action  LENS IOL TECNIS EYHANCE 20.5 - [id number] Lens LENS IOL TECNIS EYHANCE 20.5 [id number]  Right 1 Implanted   Procedure(s) with comments: CATARACT EXTRACTION PHACO AND INTRAOCULAR LENS PLACEMENT (IOC) RIGHT VISION BLUE (Right) - 12.88 0:59.8  Electronically signed: WiBirder Robson/22/2023 12:39 PM

## 2021-12-14 ENCOUNTER — Encounter: Payer: Self-pay | Admitting: Ophthalmology

## 2021-12-18 ENCOUNTER — Other Ambulatory Visit: Payer: Self-pay | Admitting: Family Medicine

## 2021-12-21 NOTE — Discharge Instructions (Signed)

## 2021-12-27 ENCOUNTER — Encounter
Admission: RE | Disposition: A | Discharge status: Home / Self Care | Payer: Self-pay | Source: Home / Self Care | Attending: Ophthalmology

## 2021-12-27 ENCOUNTER — Encounter: Payer: Self-pay | Admitting: Ophthalmology

## 2021-12-27 ENCOUNTER — Other Ambulatory Visit: Payer: Self-pay

## 2021-12-27 ENCOUNTER — Ambulatory Visit
Admission: RE | Admit: 2021-12-27 | Discharge: 2021-12-27 | Disposition: A | Discharge status: Home / Self Care | Payer: 59 | Attending: Ophthalmology | Admitting: Ophthalmology

## 2021-12-27 ENCOUNTER — Ambulatory Visit (AMBULATORY_SURGERY_CENTER): Payer: 59 | Admitting: General Practice

## 2021-12-27 ENCOUNTER — Ambulatory Visit: Payer: 59 | Admitting: General Practice

## 2021-12-27 DIAGNOSIS — J45909 Unspecified asthma, uncomplicated: Secondary | ICD-10-CM

## 2021-12-27 DIAGNOSIS — R69 Illness, unspecified: Secondary | ICD-10-CM | POA: Diagnosis not present

## 2021-12-27 DIAGNOSIS — E785 Hyperlipidemia, unspecified: Secondary | ICD-10-CM | POA: Diagnosis not present

## 2021-12-27 DIAGNOSIS — K219 Gastro-esophageal reflux disease without esophagitis: Secondary | ICD-10-CM | POA: Diagnosis not present

## 2021-12-27 DIAGNOSIS — I10 Essential (primary) hypertension: Secondary | ICD-10-CM | POA: Insufficient documentation

## 2021-12-27 DIAGNOSIS — H2512 Age-related nuclear cataract, left eye: Secondary | ICD-10-CM | POA: Diagnosis not present

## 2021-12-27 DIAGNOSIS — F17210 Nicotine dependence, cigarettes, uncomplicated: Secondary | ICD-10-CM | POA: Insufficient documentation

## 2021-12-27 HISTORY — PX: CATARACT EXTRACTION W/PHACO: SHX586

## 2021-12-27 SURGERY — PHACOEMULSIFICATION, CATARACT, WITH IOL INSERTION
Anesthesia: Monitor Anesthesia Care | Site: Eye | Laterality: Left

## 2021-12-27 MED ORDER — MOXIFLOXACIN HCL 0.5 % OP SOLN
OPHTHALMIC | Status: DC | PRN
  Administered 2021-12-27: 0.2 mL via OPHTHALMIC

## 2021-12-27 MED ORDER — BRIMONIDINE TARTRATE-TIMOLOL 0.2-0.5 % OP SOLN
OPHTHALMIC | Status: DC | PRN
  Administered 2021-12-27: 1 [drp] via OPHTHALMIC

## 2021-12-27 MED ORDER — ARMC OPHTHALMIC DILATING DROPS
1.0000 | OPHTHALMIC | Status: DC | PRN
  Administered 2021-12-27 (×3): 1 via OPHTHALMIC

## 2021-12-27 MED ORDER — TETRACAINE HCL 0.5 % OP SOLN
1.0000 [drp] | OPHTHALMIC | Status: DC | PRN
  Administered 2021-12-27 (×3): 1 [drp] via OPHTHALMIC

## 2021-12-27 MED ORDER — MIDAZOLAM HCL 2 MG/2ML IJ SOLN
INTRAMUSCULAR | Status: DC | PRN
  Administered 2021-12-27: 2 mg via INTRAVENOUS

## 2021-12-27 MED ORDER — SIGHTPATH DOSE#1 BSS IO SOLN
INTRAOCULAR | Status: DC | PRN
  Administered 2021-12-27: 1 mL

## 2021-12-27 MED ORDER — SIGHTPATH DOSE#1 NA CHONDROIT SULF-NA HYALURON 40-17 MG/ML IO SOLN
INTRAOCULAR | Status: DC | PRN
  Administered 2021-12-27: 1 mL via INTRAOCULAR

## 2021-12-27 MED ORDER — SIGHTPATH DOSE#1 BSS IO SOLN
INTRAOCULAR | Status: DC | PRN
  Administered 2021-12-27: 15 mL

## 2021-12-27 MED ORDER — LACTATED RINGERS IV SOLN: INTRAVENOUS | Status: DC

## 2021-12-27 MED ORDER — SIGHTPATH DOSE#1 BSS IO SOLN
INTRAOCULAR | Status: DC | PRN
  Administered 2021-12-27: 63 mL via OPHTHALMIC

## 2021-12-27 SURGICAL SUPPLY — 16 items
CANNULA ANT/CHMB 27G (MISCELLANEOUS) IMPLANT
CANNULA ANT/CHMB 27GA (MISCELLANEOUS) IMPLANT
CATARACT SUITE SIGHTPATH (MISCELLANEOUS) ×1 IMPLANT
FEE CATARACT SUITE SIGHTPATH (MISCELLANEOUS) ×1 IMPLANT
GLOVE SURG ENC TEXT LTX SZ8 (GLOVE) ×1 IMPLANT
GLOVE SURG TRIUMPH 8.0 PF LTX (GLOVE) ×1 IMPLANT
LENS IOL TECNIS EYHANCE 21.5 (Intraocular Lens) IMPLANT
NDL FILTER BLUNT 18X1 1/2 (NEEDLE) ×1 IMPLANT
NEEDLE FILTER BLUNT 18X 1/2SAF (NEEDLE) ×1
NEEDLE FILTER BLUNT 18X1 1/2 (NEEDLE) ×1 IMPLANT
PACK VIT ANT 23G (MISCELLANEOUS) IMPLANT
RING MALYGIN (MISCELLANEOUS) IMPLANT
SUT ETHILON 10-0 CS-B-6CS-B-6 (SUTURE)
SUTURE EHLN 10-0 CS-B-6CS-B-6 (SUTURE) IMPLANT
SYR 3ML LL SCALE MARK (SYRINGE) ×1 IMPLANT
WATER STERILE IRR 250ML POUR (IV SOLUTION) ×1 IMPLANT

## 2021-12-27 NOTE — H&P (Signed)
[facility]   Primary Care Physician:  Sharion Settler, DO Ophthalmologist: Dr. George Ina  Pre-Procedure History & Physical: HPI:  Sabrina [name] is a 66 y.o. female here for cataract surgery.   Past Medical History:  Diagnosis Date   Allergy    Chronic bronchitis 1991   GERD (gastroesophageal reflux disease)    Hyperlipidemia    not on any current medication. Had been on lipitor in the past   Hypertension    Musculoskeletal chest pain     Past Surgical History:  Procedure Laterality Date   ABDOMINAL HYSTERECTOMY  1981   removal of right ovary and uterus for infection of the uterus   CATARACT EXTRACTION W/PHACO Right 12/13/2021   Procedure: CATARACT EXTRACTION PHACO AND INTRAOCULAR LENS PLACEMENT ([place]) RIGHT VISION BLUE;  Surgeon: Birder Robson, MD;  Location: [place];  Service: Ophthalmology;  Laterality: Right;  12.88 0:59.8   CHOLECYSTECTOMY     1979    Prior to Admission medications   Medication Sig Start Date End Date Taking? Authorizing Provider  albuterol (VENTOLIN HFA) 108 (90 Base) MCG/ACT inhaler INHALE 2 PUFFS BY MOUTH INTO THE LUNGS AS NEEDED FOR WHEEZING OR SHORTNESS OF BREATH 12/19/21  Yes Sharion Settler, DO  Cholecalciferol (VITAMIN D3) 20 MCG (800 UNIT) TABS Take 1 tablet by mouth daily. Start after you complete 8 doses (8 weeks) of the 50,000 unit capsules of Vitamin D 09/06/21  Yes Espinoza, Alejandra, DO  fluticasone furoate-vilanterol (BREO ELLIPTA) 200-25 MCG/ACT AEPB Inhale 1 puff into the lungs daily. 09/01/21  Yes Sharion Settler, DO  hydrochlorothiazide (HYDRODIURIL) 25 MG tablet TAKE 1 TABLET(25 MG) BY MOUTH DAILY 01/31/21  Yes Espinoza, Alejandra, DO  lisinopril (ZESTRIL) 5 MG tablet TAKE 1 TABLET(5 MG) BY MOUTH DAILY 01/31/21  Yes Espinoza, Alejandra, DO  pravastatin (PRAVACHOL) 40 MG tablet TAKE 1 TABLET(40 MG) BY MOUTH DAILY 02/11/21  Yes Sharion Settler, DO  traZODone (DESYREL) 50 MG tablet TAKE 1 TABLET(50 MG)  BY MOUTH AT BEDTIME 09/21/21  Yes Espinoza, Alejandra, DO  aspirin-sod bicarb-citric acid (ALKA-SELTZER) 325 MG TBEF tablet Take 325 mg by mouth every 6 (six) hours as needed.    [provider]  Blood Pressure Monitoring (BLOOD PRESSURE MONITOR AUTOMAT) DEVI Check blood pressure once daily Patient not taking: Reported on 01/12/2021 11/08/20   Lind Covert, MD  dextromethorphan-guaiFENesin [facility] DM) 30-600 MG 12hr tablet Take 1 tablet by mouth 2 (two) times daily as needed for cough.    [provider]  simethicone (GAS-X) 80 MG chewable tablet Chew 1 tablet (80 mg total) by mouth every 6 (six) hours as needed for flatulence. 12/21/20   Sharion Settler, DO    Allergies as of 10/18/2021   (No Known Allergies)    Family History  Problem Relation Age of Onset   Sickle cell anemia Other    Alzheimer's disease Mother    Hyperlipidemia Mother    Hypertension Mother    Emphysema Father    Alcohol abuse Sister    Breast cancer Sister    Asthma Brother    Cervical cancer Daughter     Social History   Socioeconomic History   Marital status: Divorced    Spouse name: Not on file   Number of children: Not on file   Years of education: Not on file   Highest education level: Not on file  Occupational History   Not on file  Tobacco Use   Smoking status: Every Day    Years: 45.00  Types: Cigarettes   Smokeless tobacco: Never   Tobacco comments:    Started between age 26 and 42  Vaping Use   Vaping Use: Never used  Substance and Sexual Activity   Alcohol use: Yes    Alcohol/week: 9.0 standard drinks of alcohol    Types: 6 Cans of beer, 3 Shots of liquor per week   Drug use: No   Sexual activity: Not on file  Other Topics Concern   Not on file  Social History Narrative   Not on file   Social Determinants of Health   Financial Resource Strain: Not on file  Food Insecurity: Not on file  Transportation Needs: Not on file  Physical Activity: Not on  file  Stress: Not on file  Social Connections: Not on file  Intimate Partner Violence: Not on file    Review of Systems: See HPI, otherwise negative ROS  Physical Exam: BP 126/64   Pulse 75   Temp 97.7 F (36.5 C) (Temporal)   Ht '5\' 4"'$  (1.626 m)   Wt 69 kg   SpO2 94%   BMI 26.13 kg/m  General:   Alert, cooperative in NAD Head:  Normocephalic and atraumatic. Respiratory:  Normal work of breathing. Cardiovascular:  RRR  Impression/Plan: Sabrina [name] is here for cataract surgery.  Risks, benefits, limitations, and alternatives regarding cataract surgery have been reviewed with the patient.  Questions have been answered.  All parties agreeable.   Birder Robson, MD  12/27/2021, 9:29 AM

## 2021-12-27 NOTE — Anesthesia Preprocedure Evaluation (Signed)
Anesthesia Evaluation  Patient identified by MRN, date of birth, ID band Patient awake    Reviewed: Allergy & Precautions, NPO status , Patient's Chart, lab work & pertinent test results  History of Anesthesia Complications Negative for: history of anesthetic complications  Airway Mallampati: IV  TM Distance: >3 FB Neck ROM: full    Dental  (+) Poor Dentition, Missing, Loose   Pulmonary neg shortness of breath, asthma , neg recent URI, Current Smoker,    Pulmonary exam normal        Cardiovascular hypertension, (-) angina(-) CABG Normal cardiovascular exam(-) dysrhythmias + Valvular Problems/Murmurs      Neuro/Psych negative neurological ROS  negative psych ROS   GI/Hepatic Neg liver ROS, GERD  ,  Endo/Other  negative endocrine ROS  Renal/GU      Musculoskeletal   Abdominal   Peds  Hematology negative hematology ROS (+)   Anesthesia Other Findings Past Medical History: No date: Allergy 1991: Chronic bronchitis No date: GERD (gastroesophageal reflux disease) No date: Hyperlipidemia     Comment:  not on any current medication. 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 No date: CHOLECYSTECTOMY     Comment:  1979  BMI    Body Mass Index: 26.43 kg/m      Reproductive/Obstetrics negative OB ROS                             Anesthesia Physical  Anesthesia Plan  ASA: 2  Anesthesia Plan: MAC   Post-op Pain Management:    Induction: Intravenous  PONV Risk Score and Plan: 2 and Midazolam  Airway Management Planned: Natural Airway and Nasal Cannula  Additional Equipment:   Intra-op Plan:   Post-operative Plan:   Informed Consent: I have reviewed the patients History  and Physical, chart, labs and discussed the procedure including the risks, benefits and alternatives for the proposed anesthesia with the patient or authorized representative who has indicated his/her understanding and acceptance.     Dental Advisory Given  Plan Discussed with: Anesthesiologist, CRNA and Surgeon  Anesthesia Plan Comments: (Patient consented for risks of anesthesia including but not limited to:  - adverse reactions to medications - damage to eyes, teeth, lips or other oral mucosa - nerve damage due to positioning  - sore throat or hoarseness - Damage to heart, brain, nerves, lungs, other parts of body or loss of life  Patient voiced understanding.)        Anesthesia Quick Evaluation

## 2021-12-27 NOTE — Anesthesia Postprocedure Evaluation (Signed)
Anesthesia Post Note  Patient: Sabrina [name]  Procedure(s) Performed: CATARACT EXTRACTION PHACO AND INTRAOCULAR LENS PLACEMENT (IOC) LEFT VISION BLUE (Left: Eye)     Patient location during evaluation: PACU Anesthesia Type: MAC Level of consciousness: awake and alert Pain management: pain level controlled Vital Signs Assessment: post-procedure vital signs reviewed and stable Respiratory status: spontaneous breathing, nonlabored ventilation, respiratory function stable and patient connected to nasal cannula oxygen Cardiovascular status: stable and blood pressure returned to baseline Postop Assessment: no apparent nausea or vomiting Anesthetic complications: no   No notable events documented.  Martha Clan

## 2021-12-27 NOTE — Op Note (Signed)
PREOPERATIVE DIAGNOSIS:  Nuclear sclerotic cataract of the left eye.   POSTOPERATIVE DIAGNOSIS:  Nuclear sclerotic cataract of the left eye.   OPERATIVE PROCEDURE:ORPROCALL@   SURGEON:  Birder Robson, MD.   ANESTHESIA:  Anesthesiologist: Martha Clan, MD CRNA: Tobie Poet, CRNA  1.      Managed anesthesia care. 2.     0.28m of Shugarcaine was instilled following the paracentesis   COMPLICATIONS:  None.   TECHNIQUE:   Stop and chop   DESCRIPTION OF PROCEDURE:  The patient was examined and consented in the preoperative holding area where the aforementioned topical anesthesia was applied to the left eye and then brought back to the Operating Room where the left eye was prepped and draped in the usual sterile ophthalmic fashion and a lid speculum was placed. A paracentesis was created with the side port blade and the anterior chamber was filled with viscoelastic. A near clear corneal incision was performed with the steel keratome. A continuous curvilinear capsulorrhexis was performed with a cystotome followed by the capsulorrhexis forceps. Hydrodissection and hydrodelineation were carried out with BSS on a blunt cannula. The lens was removed in a stop and chop  technique and the remaining cortical material was removed with the irrigation-aspiration handpiece. The capsular bag was inflated with viscoelastic and the Technis ZCB00 lens was placed in the capsular bag without complication. The remaining viscoelastic was removed from the eye with the irrigation-aspiration handpiece. The wounds were hydrated. The anterior chamber was flushed with BSS and the eye was inflated to physiologic pressure. 0.187mVigamox was placed in the anterior chamber. The wounds were found to be water tight. The eye was dressed with Combigan. The patient was given protective glasses to wear throughout the day and a shield with which to sleep tonight. The patient was also given drops with which to begin a drop  regimen today and will follow-up with me in one day. Implant Name Type Inv. Item Serial No. Manufacturer Lot No. LRB No. Used Action  LENS IOL TECNIS EYHANCE 21.5 - [id number] Lens LENS IOL TECNIS EYHANCE 21.5 [id number]  Left 1 Implanted    Procedure(s) with comments: CATARACT EXTRACTION PHACO AND INTRAOCULAR LENS PLACEMENT (IOC) LEFT VISION BLUE (Left) - 5.53 0:33.1  Electronically signed: WiBirder Robson/08/2021 9:57 AM

## 2021-12-27 NOTE — Transfer of Care (Signed)
Immediate Anesthesia Transfer of Care Note  Patient: Sabrina [name]  Procedure(s) Performed: CATARACT EXTRACTION PHACO AND INTRAOCULAR LENS PLACEMENT (I[facility]) LEFT VISION BLUE (Left: Eye)  Patient Location: PACU  Anesthesia Type: MAC  Level of Consciousness: awake, alert  and patient cooperative  Airway and Oxygen Therapy: Patient Spontanous Breathing and Patient connected to supplemental oxygen  Post-op Assessment: Post-op Vital signs reviewed, Patient's Cardiovascular Status Stable, Respiratory Function Stable, Patent Airway and No signs of Nausea or vomiting  Post-op Vital Signs: Reviewed and stable  Complications: No notable events documented.

## 2021-12-28 ENCOUNTER — Encounter: Payer: Self-pay | Admitting: Ophthalmology

## 2022-01-04 ENCOUNTER — Encounter: Payer: Self-pay | Admitting: Gastroenterology

## 2022-02-02 ENCOUNTER — Other Ambulatory Visit: Payer: Self-pay

## 2022-02-02 DIAGNOSIS — I10 Essential (primary) hypertension: Secondary | ICD-10-CM

## 2022-02-02 MED ORDER — LISINOPRIL 5 MG PO TABS: ORAL_TABLET | ORAL | 4 refills | Status: AC

## 2022-02-03 ENCOUNTER — Other Ambulatory Visit: Payer: Self-pay | Admitting: Family Medicine

## 2022-02-03 DIAGNOSIS — I10 Essential (primary) hypertension: Secondary | ICD-10-CM

## 2022-02-04 ENCOUNTER — Other Ambulatory Visit: Payer: Self-pay | Admitting: Family Medicine

## 2022-02-04 DIAGNOSIS — I10 Essential (primary) hypertension: Secondary | ICD-10-CM

## 2022-03-02 ENCOUNTER — Ambulatory Visit
Admission: RE | Admit: 2022-03-02 | Discharge: 2022-03-02 | Disposition: A | Discharge status: Home / Self Care | Payer: Medicare PPO | Source: Ambulatory Visit

## 2022-03-02 DIAGNOSIS — Z1231 Encounter for screening mammogram for malignant neoplasm of breast: Secondary | ICD-10-CM

## 2022-03-05 ENCOUNTER — Other Ambulatory Visit: Payer: Self-pay | Admitting: Family Medicine

## 2022-03-05 DIAGNOSIS — I10 Essential (primary) hypertension: Secondary | ICD-10-CM

## 2022-03-07 ENCOUNTER — Other Ambulatory Visit: Payer: Self-pay | Admitting: Registered Nurse

## 2022-03-07 DIAGNOSIS — F17210 Nicotine dependence, cigarettes, uncomplicated: Secondary | ICD-10-CM

## 2022-03-07 DIAGNOSIS — J449 Chronic obstructive pulmonary disease, unspecified: Secondary | ICD-10-CM

## 2022-03-08 ENCOUNTER — Ambulatory Visit (AMBULATORY_SURGERY_CENTER): Payer: Self-pay | Admitting: *Deleted

## 2022-03-08 ENCOUNTER — Other Ambulatory Visit: Payer: Self-pay | Admitting: Registered Nurse

## 2022-03-08 VITALS — Ht 64.0 in | Wt 153.0 lb

## 2022-03-08 DIAGNOSIS — Z1211 Encounter for screening for malignant neoplasm of colon: Secondary | ICD-10-CM

## 2022-03-08 DIAGNOSIS — E2839 Other primary ovarian failure: Secondary | ICD-10-CM

## 2022-03-08 MED ORDER — NA SULFATE-K SULFATE-MG SULF 17.5-3.13-1.6 GM/177ML PO SOLN
1.0000 | Freq: Once | ORAL | 0 refills | Status: AC
Start: 2022-03-08 — End: 2022-03-08

## 2022-03-08 NOTE — Progress Notes (Signed)
No egg or soy allergy known to patient  No issues known to pt with past sedation with any surgeries or procedures Patient denies ever being told they had issues or difficulty with intubation  No FH of Malignant Hyperthermia Pt is not on diet pills Pt is not on  home 02  Pt is not on blood thinners  Pt denies issues with constipation  Pt encouraged to use to use Singlecare or Goodrx to reduce cost  Patient's chart reviewed by [name] CNRA prior to previsit and patient appropriate for the [facility].  Previsit completed and red dot placed by patient's name on their procedure day (on provider's schedule).     

## 2022-03-13 ENCOUNTER — Encounter: Payer: Self-pay | Admitting: Gastroenterology

## 2022-03-20 ENCOUNTER — Encounter: Payer: Self-pay | Admitting: Gastroenterology

## 2022-03-20 ENCOUNTER — Ambulatory Visit (AMBULATORY_SURGERY_CENTER): Payer: Medicare PPO | Admitting: Gastroenterology

## 2022-03-20 VITALS — BP 126/62 | HR 62 | Temp 96.6°F | Resp 15 | Ht 64.0 in | Wt 153.0 lb

## 2022-03-20 DIAGNOSIS — D125 Benign neoplasm of sigmoid colon: Secondary | ICD-10-CM

## 2022-03-20 DIAGNOSIS — D124 Benign neoplasm of descending colon: Secondary | ICD-10-CM

## 2022-03-20 DIAGNOSIS — Z1211 Encounter for screening for malignant neoplasm of colon: Secondary | ICD-10-CM

## 2022-03-20 MED ORDER — SODIUM CHLORIDE 0.9 % IV SOLN: 500.0000 mL | Freq: Once | INTRAVENOUS | Status: DC

## 2022-03-20 NOTE — Progress Notes (Signed)
Pt's states no medical or surgical changes since previsit or office visit. VS assessed by D.T 

## 2022-03-20 NOTE — Op Note (Signed)
[place] Patient Name: Sabrina [name] Procedure Date: 03/20/2022 3:12 PM MRN: [id number] Endoscopist: Ladene Artist , MD, [id number] Age: 66 Referring MD:  Date of Birth: 05/08/1955 Gender: Female Account #: [id number] Procedure:                Colonoscopy Indications:              Screening for colorectal malignant neoplasm Medicines:                Monitored Anesthesia Care Procedure:                Pre-Anesthesia Assessment:                           - Prior to the procedure, a History and Physical                            was performed, and patient medications and                            allergies were reviewed. The patient's tolerance of                            previous anesthesia was also reviewed. The risks                            and benefits of the procedure and the sedation                            options and risks were discussed with the patient.                            All questions were answered, and informed consent                            was obtained. Prior Anticoagulants: The patient has                            taken no anticoagulant or antiplatelet agents. ASA                            Grade Assessment: III - A patient with severe                            systemic disease. After reviewing the risks and                            benefits, the patient was deemed in satisfactory                            condition to undergo the procedure.                           After obtaining informed consent, the colonoscope  was passed under direct vision. Throughout the                            procedure, the patient's blood pressure, pulse, and                            oxygen saturations were monitored continuously. The                            PCF-HQ190L Colonoscope was introduced through the                            anus and advanced to the the cecum, identified by                             appendiceal orifice and ileocecal valve. The                            ileocecal valve, appendiceal orifice, and rectum                            were photographed. The quality of the bowel                            preparation was excellent. The colonoscopy was                            performed without difficulty. The patient tolerated                            the procedure well. Scope In: 3:17:49 PM Scope Out: 3:36:24 PM Scope Withdrawal Time: 0 hours 16 minutes 1 second  Total Procedure Duration: 0 hours 18 minutes 35 seconds  Findings:                 The perianal and digital rectal examinations were                            normal.                           Two sessile polyps were found in the sigmoid colon                            and descending colon. The polyps were 6 to 8 mm in                            size. These polyps were removed with a cold snare.                            Resection and retrieval were complete.                           Multiple small-mouthed diverticula were found in  the left colon. There was no evidence of                            diverticular bleeding.                           Internal hemorrhoids were found during                            retroflexion. The hemorrhoids were moderate and                            Grade I (internal hemorrhoids that do not prolapse).                           The exam was otherwise without abnormality on                            direct and retroflexion views. Complications:            No immediate complications. Estimated blood loss:                            None. Estimated Blood Loss:     Estimated blood loss: none. Impression:               - Two 6 to 8 mm polyps in the sigmoid colon and in                            the descending colon, removed with a cold snare.                            Resected and retrieved.                           - Mild diverticulosis in the  left colon.                           - Internal hemorrhoids.                           - The examination was otherwise normal on direct                            and retroflexion views. Recommendation:           - Repeat colonoscopy after studies are complete for                            surveillance based on pathology results.                           - Patient has a contact number available for                            emergencies. The signs and symptoms of potential  delayed complications were discussed with the                            patient. Return to normal activities tomorrow.                            Written discharge instructions were provided to the                            patient.                           - High fiber diet.                           - Continue present medications.                           - Await pathology results. Ladene Artist, MD 03/20/2022 3:40:15 PM This report has been signed electronically.

## 2022-03-20 NOTE — Progress Notes (Signed)
History & Physical  Primary Care Physician:  Arthur Holms, NP Primary Gastroenterologist: Lucio Edward, MD  CHIEF COMPLAINT:  CRC screening  HPI: Sabrina [name] is a 66 y.o. female who is average risk CRC screening with colonoscopy.    Past Medical History:  Diagnosis Date   Allergy    Cataract    Chronic bronchitis 1991   COPD (chronic obstructive pulmonary disease) (HCC)    GERD (gastroesophageal reflux disease)    Heart murmur    Hyperlipidemia    not on any current medication. Had been on lipitor in the past   Hypertension    Musculoskeletal chest pain     Past Surgical History:  Procedure Laterality Date   ABDOMINAL HYSTERECTOMY  04/25/1979   removal of right ovary and uterus for infection of the uterus   CATARACT EXTRACTION W/PHACO Right 12/13/2021   Procedure: CATARACT EXTRACTION PHACO AND INTRAOCULAR LENS PLACEMENT ([facility]) RIGHT VISION BLUE;  Surgeon: Birder Robson, MD;  Location: [facility];  Service: Ophthalmology;  Laterality: Right;  12.88 0:59.8   CATARACT EXTRACTION W/PHACO Left 12/27/2021   Procedure: CATARACT EXTRACTION PHACO AND INTRAOCULAR LENS PLACEMENT ([facility]) LEFT VISION BLUE;  Surgeon: Birder Robson, MD;  Location: [facility];  Service: Ophthalmology;  Laterality: Left;  5.53 0:33.1   CHOLECYSTECTOMY     1979   COLONOSCOPY  2013   2013 digestive health    Prior to Admission medications   Medication Sig Start Date End Date Taking? Authorizing Provider  albuterol (VENTOLIN HFA) 108 (90 Base) MCG/ACT inhaler INHALE 2 PUFFS BY MOUTH INTO THE LUNGS AS NEEDED FOR WHEEZING OR SHORTNESS OF BREATH 12/19/21  Yes Espinoza, Alejandra, DO  fluticasone furoate-vilanterol (BREO ELLIPTA) 200-25 MCG/ACT AEPB Inhale 1 puff into the lungs daily. 09/01/21  Yes Sharion Settler, DO  hydrochlorothiazide (HYDRODIURIL) 25 MG tablet TAKE 1 TABLET BY MOUTH DAILY 03/06/22  Yes Espinoza, Alejandra, DO  lisinopril (ZESTRIL) 5 MG tablet TAKE 1 TABLET(5  MG) BY MOUTH DAILY 02/02/22  Yes Espinoza, Alejandra, DO  pravastatin (PRAVACHOL) 40 MG tablet TAKE 1 TABLET(40 MG) BY MOUTH DAILY 02/11/21  Yes Espinoza, Alejandra, DO  aspirin-sod bicarb-citric acid (ALKA-SELTZER) 325 MG TBEF tablet Take 325 mg by mouth every 6 (six) hours as needed. Patient not taking: Reported on 03/20/2022    [provider]  Blood Pressure Monitoring (BLOOD PRESSURE MONITOR AUTOMAT) DEVI Check blood pressure once daily Patient not taking: Reported on 03/20/2022 11/08/20   Lind Covert, MD  Cholecalciferol (VITAMIN D3) 20 MCG (800 UNIT) TABS Take 1 tablet by mouth daily. Start after you complete 8 doses (8 weeks) of the 50,000 unit capsules of Vitamin D Patient not taking: Reported on 03/20/2022 09/06/21   Sharion Settler, DO  dextromethorphan-guaiFENesin (MUCINEX DM) 30-600 MG 12hr tablet Take 1 tablet by mouth 2 (two) times daily as needed for cough. Patient not taking: Reported on 03/20/2022    [provider]  simethicone (GAS-X) 80 MG chewable tablet Chew 1 tablet (80 mg total) by mouth every 6 (six) hours as needed for flatulence. Patient not taking: Reported on 03/20/2022 12/21/20   Sharion Settler, DO  traZODone (DESYREL) 50 MG tablet TAKE 1 TABLET(50 MG) BY MOUTH AT BEDTIME Patient not taking: Reported on 03/20/2022 09/21/21   Sharion Settler, DO    Current Outpatient Medications  Medication Sig Dispense Refill   albuterol (VENTOLIN HFA) 108 (90 Base) MCG/ACT inhaler INHALE 2 PUFFS BY MOUTH INTO THE LUNGS AS NEEDED FOR WHEEZING OR SHORTNESS OF BREATH 8.5  g 1   fluticasone furoate-vilanterol (BREO ELLIPTA) 200-25 MCG/ACT AEPB Inhale 1 puff into the lungs daily. 60 each 3   hydrochlorothiazide (HYDRODIURIL) 25 MG tablet TAKE 1 TABLET BY MOUTH DAILY 30 tablet 3   lisinopril (ZESTRIL) 5 MG tablet TAKE 1 TABLET(5 MG) BY MOUTH DAILY 90 tablet 4   pravastatin (PRAVACHOL) 40 MG tablet TAKE 1 TABLET(40 MG) BY MOUTH DAILY 60 tablet 6    aspirin-sod bicarb-citric acid (ALKA-SELTZER) 325 MG TBEF tablet Take 325 mg by mouth every 6 (six) hours as needed. (Patient not taking: Reported on 03/20/2022)     Blood Pressure Monitoring (BLOOD PRESSURE MONITOR AUTOMAT) DEVI Check blood pressure once daily (Patient not taking: Reported on 03/20/2022) 1 each 0   Cholecalciferol (VITAMIN D3) 20 MCG (800 UNIT) TABS Take 1 tablet by mouth daily. Start after you complete 8 doses (8 weeks) of the 50,000 unit capsules of Vitamin D (Patient not taking: Reported on 03/20/2022) 75 tablet 1   dextromethorphan-guaiFENesin (MUCINEX DM) 30-600 MG 12hr tablet Take 1 tablet by mouth 2 (two) times daily as needed for cough. (Patient not taking: Reported on 03/20/2022)     simethicone (GAS-X) 80 MG chewable tablet Chew 1 tablet (80 mg total) by mouth every 6 (six) hours as needed for flatulence. (Patient not taking: Reported on 03/20/2022) 30 tablet 0   traZODone (DESYREL) 50 MG tablet TAKE 1 TABLET(50 MG) BY MOUTH AT BEDTIME (Patient not taking: Reported on 03/20/2022) 30 tablet 1   Current Facility-Administered Medications  Medication Dose Route Frequency Provider Last Rate Last Admin   0.9 %  sodium chloride infusion  500 mL Intravenous Once Ladene Artist, MD        Allergies as of 03/20/2022   (No Known Allergies)    Family History  Problem Relation Age of Onset   Alzheimer's disease Mother    Hyperlipidemia Mother    Hypertension Mother    Emphysema Father    Alcohol abuse Sister    Breast cancer Sister    Asthma Brother    Cervical cancer Daughter    Sickle cell anemia Other    Colon polyps Son    Crohn's disease Neg Hx    Colon cancer Neg Hx    Rectal cancer Neg Hx    Esophageal cancer Neg Hx    Stomach cancer Neg Hx     Social History   Socioeconomic History   Marital status: Divorced    Spouse name: Not on file   Number of children: Not on file   Years of education: Not on file   Highest education level: Not on file   Occupational History   Not on file  Tobacco Use   Smoking status: Every Day    Packs/day: 1.00    Years: 45.00    Total pack years: 45.00    Types: Cigarettes   Smokeless tobacco: Never   Tobacco comments:    Started between age 51 and 27  Vaping Use   Vaping Use: Never used  Substance and Sexual Activity   Alcohol use: Yes    Alcohol/week: 9.0 standard drinks of alcohol    Types: 6 Cans of beer, 3 Shots of liquor per week   Drug use: No   Sexual activity: Not on file  Other Topics Concern   Not on file  Social History Narrative   Not on file   Social Determinants of Health   Financial Resource Strain: Not on file  Food Insecurity: Not on file  Transportation Needs: Not on file  Physical Activity: Not on file  Stress: Not on file  Social Connections: Not on file  Intimate Partner Violence: Not on file    Review of Systems:  All systems reviewed were negative except where noted in HPI.   Physical Exam: General:  Alert, well-developed, in NAD Head:  Normocephalic and atraumatic. Eyes:  Sclera clear, no icterus.   Conjunctiva pink. Ears:  Normal auditory acuity. Mouth:  No deformity or lesions.  Neck:  Supple; no masses . Lungs:  Clear throughout to auscultation.   No wheezes, crackles, or rhonchi. No acute distress. Heart:  Regular rate and rhythm; no murmurs. Abdomen:  Soft, nondistended, nontender. No masses, hepatomegaly. No obvious masses.  Normal bowel .    Rectal:  Deferred   Msk:  Symmetrical without gross deformities.. Pulses:  Normal pulses noted. Extremities:  Without edema. Neurologic:  Alert and  oriented x4;  grossly normal neurologically. Skin:  Intact without significant lesions or rashes. Cervical Nodes:  No significant cervical adenopathy. Psych:  Alert and cooperative. Normal mood and affect.   Impression / Plan:   Average risk CRC screening with colonoscopy.   Pricilla Riffle. Fuller Plan  03/20/2022, 3:09 PM See Shea Evans, [facility], to contact  our on call provider

## 2022-03-20 NOTE — Progress Notes (Signed)
Pt resting comfortably. VSS. Airway intact. SBAR complete to RN. All questions answered.   

## 2022-03-20 NOTE — Progress Notes (Signed)
Called to room to assist during endoscopic procedure.  Patient ID and intended procedure confirmed with present staff. Received instructions for my participation in the procedure from the performing physician.  

## 2022-03-20 NOTE — Patient Instructions (Signed)
Handout on polyps and diverticulosis given. High fiber diet.  Continue present medications.    YOU HAD AN ENDOSCOPIC PROCEDURE TODAY AT [facility]:   Refer to the procedure report that was given to you for any specific questions about what was found during the examination.  If the procedure report does not answer your questions, please call your gastroenterologist to clarify.  If you requested that your care partner not be given the details of your procedure findings, then the procedure report has been included in a sealed envelope for you to review at your convenience later.  YOU SHOULD EXPECT: Some feelings of bloating in the abdomen. Passage of more gas than usual.  Walking can help get rid of the air that was put into your GI tract during the procedure and reduce the bloating. If you had a lower endoscopy (such as a colonoscopy or flexible sigmoidoscopy) you may notice spotting of blood in your stool or on the toilet paper. If you underwent a bowel prep for your procedure, you may not have a normal bowel movement for a few days.  Please Note:  You might notice some irritation and congestion in your nose or some drainage.  This is from the oxygen used during your procedure.  There is no need for concern and it should clear up in a day or so.  SYMPTOMS TO REPORT IMMEDIATELY:  Following lower endoscopy (colonoscopy or flexible sigmoidoscopy):  Excessive amounts of blood in the stool  Significant tenderness or worsening of abdominal pains  Swelling of the abdomen that is new, acute  Fever of 100F or higher   For urgent or emergent issues, a gastroenterologist can be reached at any hour by calling [phone]. Do not use MyChart messaging for urgent concerns.    DIET:  We do recommend a small meal at first, but then you may proceed to your regular diet.  Drink plenty of fluids but you should avoid alcoholic beverages for 24 hours.  ACTIVITY:  You should plan to take it  easy for the rest of today and you should NOT DRIVE or use heavy machinery until tomorrow (because of the sedation medicines used during the test).    FOLLOW UP: Our staff will call the number listed on your records the next business day following your procedure.  We will call around 7:15- 8:00 am to check on you and address any questions or concerns that you may have regarding the information given to you following your procedure. If we do not reach you, we will leave a message.     If any biopsies were taken you will be contacted by phone or by letter within the next 1-3 weeks.  Please call us at [phone] if you have not heard about the biopsies in 3 weeks.    SIGNATURES/CONFIDENTIALITY: You and/or your care partner have signed paperwork which will be entered into your electronic medical record.  These signatures attest to the fact that that the information above on your After Visit Summary has been reviewed and is understood.  Full responsibility of the confidentiality of this discharge information lies with you and/or your care-partner.

## 2022-03-21 ENCOUNTER — Telehealth: Payer: Self-pay

## 2022-03-21 NOTE — Telephone Encounter (Signed)
  Follow up Call-     03/20/2022    3:00 PM  Call back number  Post procedure Call Back phone  # [phone]  Permission to leave phone message Yes     Patient questions:  Do you have a fever, pain , or abdominal swelling? No. Pain Score  0 *  Have you tolerated food without any problems? Yes.    Have you been able to return to your normal activities? Yes.    Do you have any questions about your discharge instructions: Diet   No. Medications  No. Follow up visit  No.  Do you have questions or concerns about your Care? No.  Actions: * If pain score is 4 or above: No action needed, pain <4.

## 2022-03-31 ENCOUNTER — Other Ambulatory Visit: Payer: Self-pay | Admitting: Family Medicine

## 2022-03-31 DIAGNOSIS — R748 Abnormal levels of other serum enzymes: Secondary | ICD-10-CM

## 2022-03-31 NOTE — Progress Notes (Signed)
Future CMP order placed to follow previously elevated Alk Phos

## 2022-04-03 ENCOUNTER — Other Ambulatory Visit: Payer: Self-pay | Admitting: Family Medicine

## 2022-04-05 ENCOUNTER — Encounter: Payer: Self-pay | Admitting: Gastroenterology

## 2022-04-13 ENCOUNTER — Ambulatory Visit
Admission: RE | Admit: 2022-04-13 | Discharge: 2022-04-13 | Disposition: A | Discharge status: Home / Self Care | Payer: Medicare PPO | Source: Ambulatory Visit | Attending: Registered Nurse | Admitting: Registered Nurse

## 2022-04-13 DIAGNOSIS — J449 Chronic obstructive pulmonary disease, unspecified: Secondary | ICD-10-CM

## 2022-04-13 DIAGNOSIS — F17210 Nicotine dependence, cigarettes, uncomplicated: Secondary | ICD-10-CM

## 2022-04-19 IMAGING — CT CT CHEST LUNG CANCER SCREENING LOW DOSE W/O CM
1 series · 10 of 10 positions shown, 13 images · non-contrast
Comparison: None.

CLINICAL DATA: Current smoker, 40 pack-year history

EXAM:
CT CHEST WITHOUT CONTRAST LOW-DOSE FOR LUNG CANCER SCREENING
TECHNIQUE: Multidetector CT imaging of the chest was performed following the
standard protocol without IV contrast.

[ct lung segmentation data · axial · 0.68mm/px · z∈[-312,-312]mm · 10 of 297 frames shown]
[frame 1/297  mediastinal]
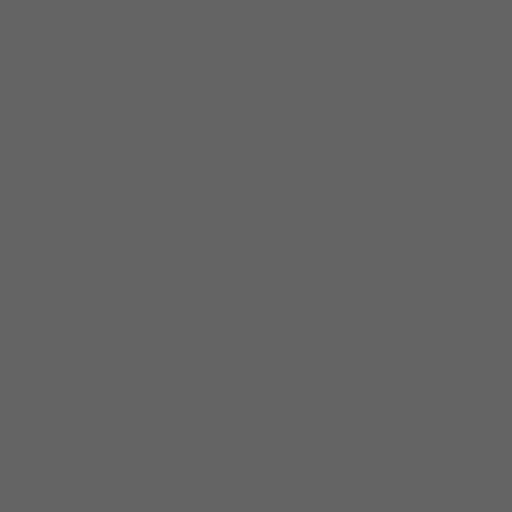
[frame 1/297  lung]
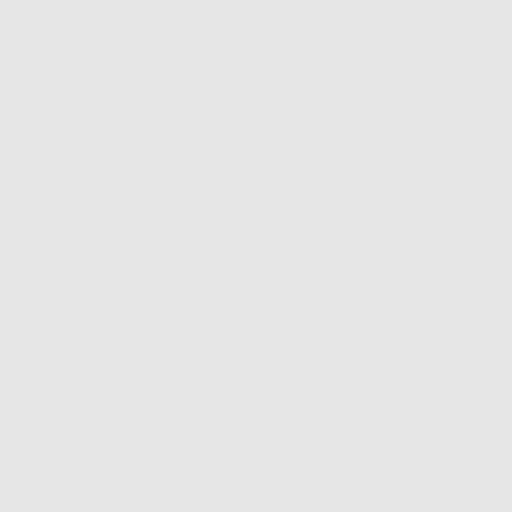
[frame 33/297  lung]
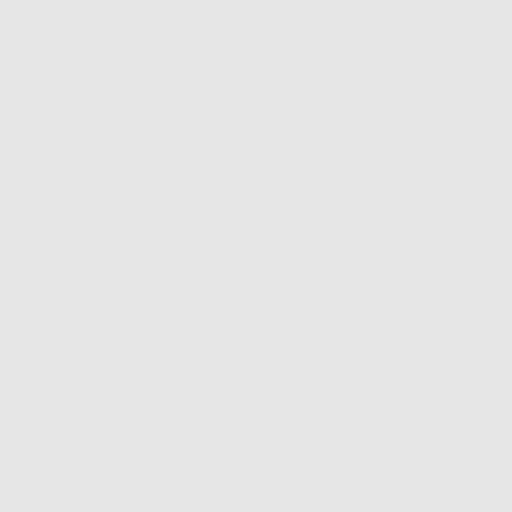
[frame 66/297  lung]
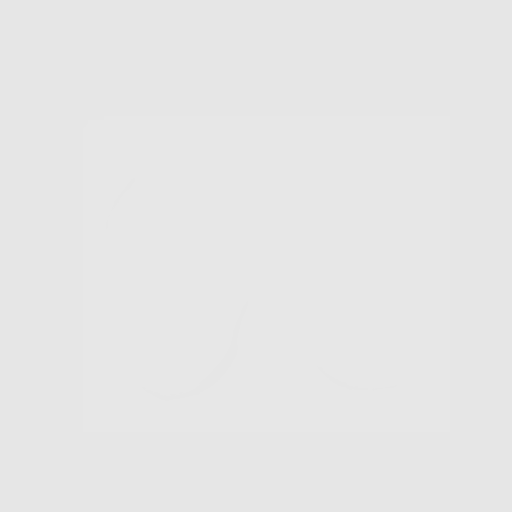
[frame 99/297  lung]
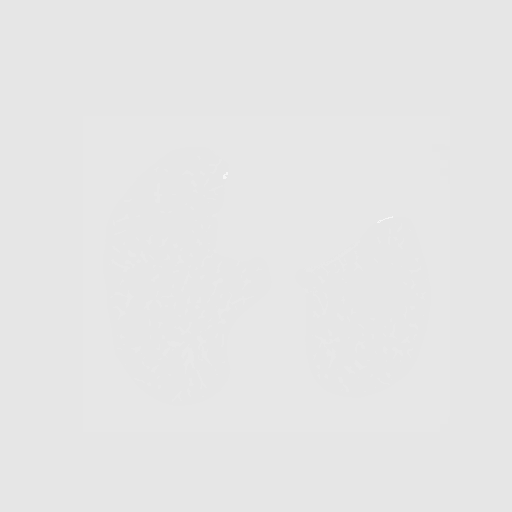
[frame 132/297  mediastinal]
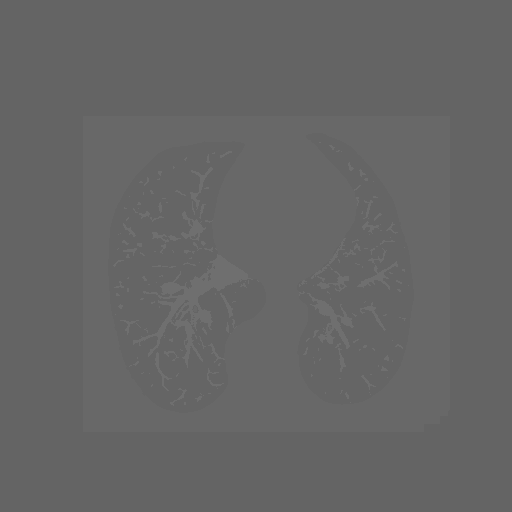
[frame 132/297  lung]
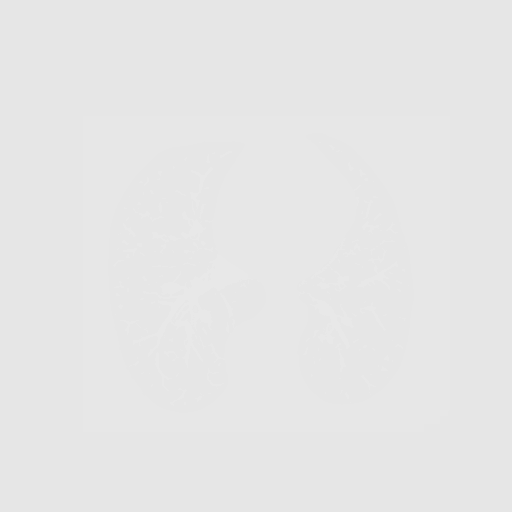
[frame 165/297  lung]
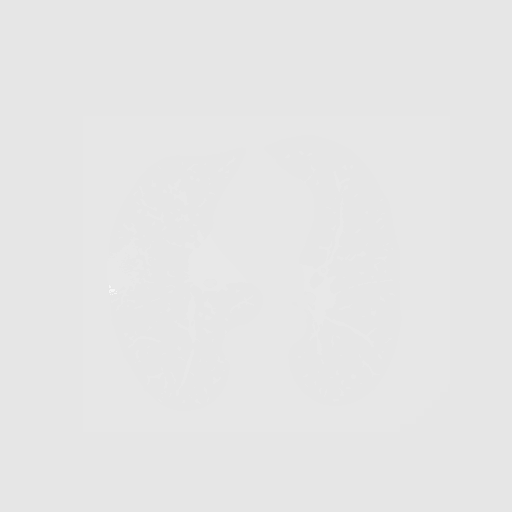
[frame 198/297  lung]
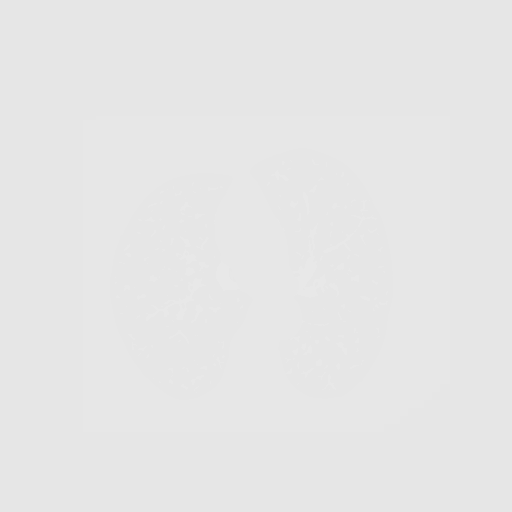
[frame 231/297  lung]
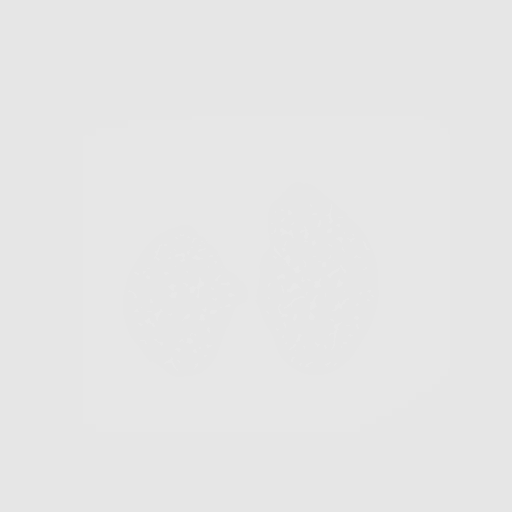
[frame 264/297  mediastinal]
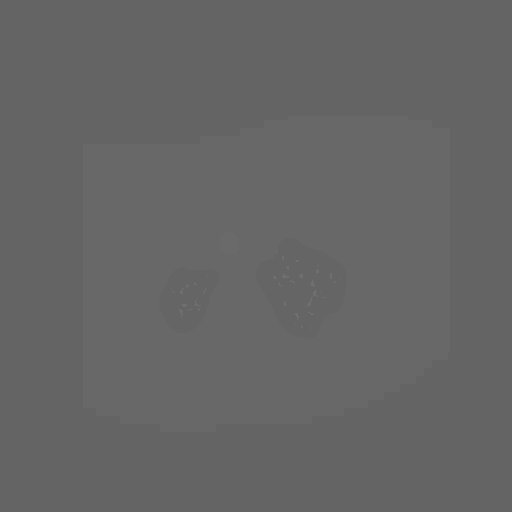
[frame 264/297  lung]
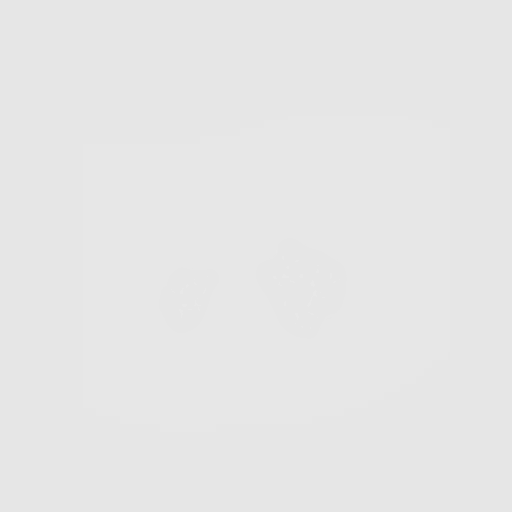
[frame 297/297  lung]
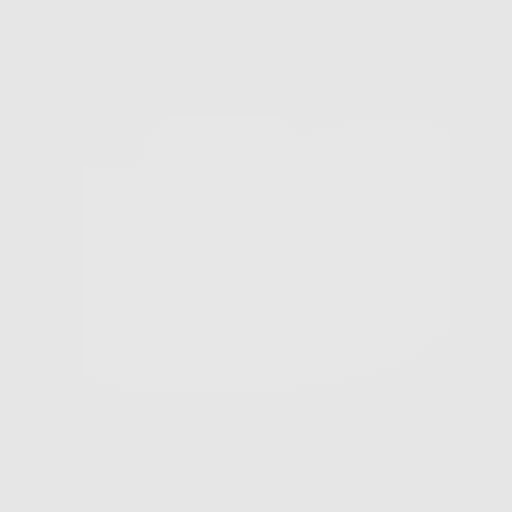

[10 of 10 positions shown; findings below may reference images not displayed]

FINDINGS: Cardiovascular: Atherosclerotic calcification of the aorta, aortic
valve and coronary arteries. Heart size normal. No pericardial
effusion.

Mediastinum/Nodes: No pathologically enlarged mediastinal or
axillary lymph nodes. Hilar regions are difficult to definitively
evaluate without IV contrast but appear grossly unremarkable.
Esophagus is grossly unremarkable.

Lungs/Pleura: Centrilobular emphysema. Smoking related respiratory
bronchiolitis. No suspicious pulmonary nodules. No pleural fluid.
Airway is unremarkable.

Upper Abdomen: Visualized portions of the liver, adrenal glands,
kidneys, spleen, pancreas, stomach and bowel are grossly
unremarkable.

Musculoskeletal: Degenerative changes in the spine. No worrisome
lytic or sclerotic lesions.
IMPRESSION: 1. Lung-RADS 1, negative. Continue annual screening with low-dose
chest CT without contrast in 12 months.
2. Aortic atherosclerosis (O7M6A-6TI.I). Coronary artery
calcification.
3.  Emphysema ([id number].W).

## 2022-05-09 ENCOUNTER — Ambulatory Visit
Admission: RE | Admit: 2022-05-09 | Discharge: 2022-05-09 | Disposition: A | Discharge status: Home / Self Care | Payer: Medicare PPO | Source: Ambulatory Visit | Attending: Registered Nurse | Admitting: Registered Nurse

## 2022-05-09 DIAGNOSIS — E2839 Other primary ovarian failure: Secondary | ICD-10-CM

## 2022-05-10 ENCOUNTER — Ambulatory Visit (INDEPENDENT_AMBULATORY_CARE_PROVIDER_SITE_OTHER): Payer: Medicare PPO

## 2022-05-10 ENCOUNTER — Ambulatory Visit (HOSPITAL_COMMUNITY)
Admission: EM | Admit: 2022-05-10 | Discharge: 2022-05-10 | Disposition: A | Discharge status: Home / Self Care | Payer: Medicare PPO | Attending: Family Medicine | Admitting: Family Medicine

## 2022-05-10 ENCOUNTER — Encounter (HOSPITAL_COMMUNITY): Payer: Self-pay

## 2022-05-10 DIAGNOSIS — M546 Pain in thoracic spine: Secondary | ICD-10-CM

## 2022-05-10 DIAGNOSIS — M545 Low back pain, unspecified: Secondary | ICD-10-CM | POA: Diagnosis not present

## 2022-05-10 DIAGNOSIS — R079 Chest pain, unspecified: Secondary | ICD-10-CM | POA: Diagnosis not present

## 2022-05-10 DIAGNOSIS — M549 Dorsalgia, unspecified: Secondary | ICD-10-CM

## 2022-05-10 MED ORDER — IBUPROFEN 600 MG PO TABS: 600.0000 mg | ORAL_TABLET | Freq: Three times a day (TID) | ORAL | 0 refills | Status: AC | PRN

## 2022-05-10 MED ORDER — BACLOFEN 10 MG PO TABS: 10.0000 mg | ORAL_TABLET | Freq: Three times a day (TID) | ORAL | 0 refills | Status: AC | PRN

## 2022-05-10 NOTE — Discharge Instructions (Signed)
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  There were no masses in your chest  Take ibuprofen 600 mg--1 tab every 8 hours as needed for pain.  Take baclofen 10 mg--1 tablet every 8 hours as needed for muscle spasm or muscle pain.  This medication could cause drowsiness or dizziness  He is make a follow-up appointment with your primary care for follow-up this issue

## 2022-05-10 NOTE — ED Provider Notes (Signed)
[place]    CSN: [id number] Arrival date & time: 05/10/22  1421      History   Chief Complaint Chief Complaint  Patient presents with   Back Pain    HPI Sabrina [name] is a 67 y.o. female.    Back Pain  Here for mid and low back pain radiating around to her chest and her abdomen.  It began bothering her about a week ago.  No recent trauma or fall.  No fever or chills.  She does cough but she states that is because she has chronic bronchitis.  No nausea but she did vomit today, she feels due to her reflux.  Currently she is not nauseated  No dysuria.  Lying down seems to make the pain radiate around.  Does smoke Past Medical History:  Diagnosis Date   Allergy    Cataract    Chronic bronchitis 1991   COPD (chronic obstructive pulmonary disease) (HCC)    GERD (gastroesophageal reflux disease)    Heart murmur    Hyperlipidemia    not on any current medication. Had been on lipitor in the past   Hypertension    Musculoskeletal chest pain     Patient Active Problem List   Diagnosis Date Noted   Medication refill 01/12/2021   Healthcare maintenance 12/21/2020   Sleeping difficulties 12/21/2020   Elevated alkaline phosphatase level 09/30/2020   Hyperlipidemia 09/29/2020   Alcohol use 09/29/2020   Colon spasm 35/59/7416   Acute alcoholic gastritis without hemorrhage 10/23/2019   Onychomycosis 09/19/2013   Acid reflux 05/08/2012   History of colon polyps 11/16/2011   Back pain 10/27/2011   Hypertension 38/45/3646   Systolic murmur 80/32/1224   Obesity (BMI 30.0-34.9) 08/20/2011   Chronic bronchitis ([place]) 08/18/2011   Tobacco use disorder 04/25/1971    Past Surgical History:  Procedure Laterality Date   ABDOMINAL HYSTERECTOMY  04/25/1979   removal of right ovary and uterus for infection of the uterus   CATARACT EXTRACTION W/PHACO Right 12/13/2021   Procedure: CATARACT EXTRACTION PHACO AND INTRAOCULAR LENS PLACEMENT ([place]) RIGHT VISION BLUE;   Surgeon: Birder Robson, MD;  Location: [facility];  Service: Ophthalmology;  Laterality: Right;  12.88 0:59.8   CATARACT EXTRACTION W/PHACO Left 12/27/2021   Procedure: CATARACT EXTRACTION PHACO AND INTRAOCULAR LENS PLACEMENT ([place]) LEFT VISION BLUE;  Surgeon: Birder Robson, MD;  Location: [facility];  Service: Ophthalmology;  Laterality: Left;  5.53 0:33.1   CHOLECYSTECTOMY     1979   COLONOSCOPY  2013   2013 digestive health    OB History   No obstetric history on file.      Home Medications    Prior to Admission medications   Medication Sig Start Date End Date Taking? Authorizing Provider  albuterol (VENTOLIN HFA) 108 (90 Base) MCG/ACT inhaler INHALE 2 PUFFS BY MOUTH INTO THE LUNGS AS NEEDED FOR WHEEZING OR SHORTNESS OF BREATH 12/19/21  Yes Espinoza, Alejandra, DO  aspirin-sod bicarb-citric acid (ALKA-SELTZER) 325 MG TBEF tablet Take 325 mg by mouth every 6 (six) hours as needed.   Yes [provider]  baclofen (LIORESAL) 10 MG tablet Take 1 tablet (10 mg total) by mouth every 8 (eight) hours as needed for muscle spasms. 05/10/22  Yes Barrett Henle, MD  Blood Pressure Monitoring (BLOOD PRESSURE MONITOR AUTOMAT) DEVI Check blood pressure once daily 11/08/20  Yes Chambliss, Jeb Levering, MD  Cholecalciferol (VITAMIN D3) 20 MCG (800 UNIT) TABS Take 1 tablet by mouth daily. Start after  you complete 8 doses (8 weeks) of the 50,000 unit capsules of Vitamin D 09/06/21  Yes Espinoza, Alejandra, DO  fluticasone furoate-vilanterol (BREO ELLIPTA) 200-25 MCG/ACT AEPB Inhale 1 puff into the lungs daily. 09/01/21  Yes Sharion Settler, DO  hydrochlorothiazide (HYDRODIURIL) 25 MG tablet TAKE 1 TABLET BY MOUTH DAILY 03/06/22  Yes Espinoza, Alejandra, DO  ibuprofen (ADVIL) 600 MG tablet Take 1 tablet (600 mg total) by mouth every 8 (eight) hours as needed (pain). 05/10/22  Yes Barrett Henle, MD  lisinopril (ZESTRIL) 5 MG tablet TAKE 1 TABLET(5 MG) BY MOUTH DAILY  02/02/22  Yes Sharion Settler, DO  pravastatin (PRAVACHOL) 40 MG tablet TAKE 1 TABLET(40 MG) BY MOUTH DAILY 04/03/22  Yes Espinoza, Alejandra, DO  traZODone (DESYREL) 50 MG tablet TAKE 1 TABLET(50 MG) BY MOUTH AT BEDTIME 09/21/21  Yes Sharion Settler, DO    Family History Family History  Problem Relation Age of Onset   Alzheimer's disease Mother    Hyperlipidemia Mother    Hypertension Mother    Emphysema Father    Alcohol abuse Sister    Breast cancer Sister    Asthma Brother    Cervical cancer Daughter    Sickle cell anemia Other    Colon polyps Son    Crohn's disease Neg Hx    Colon cancer Neg Hx    Rectal cancer Neg Hx    Esophageal cancer Neg Hx    Stomach cancer Neg Hx     Social History Social History   Tobacco Use   Smoking status: Every Day    Packs/day: 1.00    Years: 45.00    Total pack years: 45.00    Types: Cigarettes   Smokeless tobacco: Never   Tobacco comments:    Started between age 59 and 71  Vaping Use   Vaping Use: Never used  Substance Use Topics   Alcohol use: Yes    Alcohol/week: 9.0 standard drinks of alcohol    Types: 6 Cans of beer, 3 Shots of liquor per week   Drug use: No     Allergies   Patient has no known allergies.   Review of Systems Review of Systems  Musculoskeletal:  Positive for back pain.     Physical Exam Triage Vital Signs ED Triage Vitals  Enc Vitals Group     BP 05/10/22 1459 136/73     Pulse Rate 05/10/22 1459 (!) 122     Resp 05/10/22 1459 16     Temp 05/10/22 1459 98.4 F (36.9 C)     Temp Source 05/10/22 1459 Oral     SpO2 05/10/22 1459 96 %     Weight --      Height --      Head Circumference --      Peak Flow --      Pain Score 05/10/22 1504 6     Pain Loc --      Pain Edu? --      Excl. in [place]? --    No data found.  Updated Vital Signs BP 136/73 (BP Location: Left Arm)   Pulse (!) 122   Temp 98.4 F (36.9 C) (Oral)   Resp 16   SpO2 96%   Visual Acuity Right Eye Distance:    Left Eye Distance:   Bilateral Distance:    Right Eye Near:   Left Eye Near:    Bilateral Near:     Physical Exam Vitals reviewed.  Constitutional:  General: She is not in acute distress.    Appearance: She is not ill-appearing, toxic-appearing or diaphoretic.  HENT:     Mouth/Throat:     Mouth: Mucous membranes are moist.  Eyes:     Extraocular Movements: Extraocular movements intact.     Conjunctiva/sclera: Conjunctivae normal.     Pupils: Pupils are equal, round, and reactive to light.  Cardiovascular:     Rate and Rhythm: Normal rate and regular rhythm.     Heart sounds: No murmur heard. Pulmonary:     Effort: No respiratory distress.     Breath sounds: No stridor. No wheezing, rhonchi or rales.  Chest:     Chest wall: No tenderness.  Abdominal:     Palpations: Abdomen is soft.     Tenderness: There is no abdominal tenderness.  Musculoskeletal:        General: No tenderness.     Cervical back: Neck supple. No tenderness.  Lymphadenopathy:     Cervical: No cervical adenopathy.  Skin:    Coloration: Skin is not jaundiced or pale.     Findings: No rash.  Neurological:     General: No focal deficit present.     Mental Status: She is alert and oriented to person, place, and time.  Psychiatric:        Behavior: Behavior normal.      UC Treatments / Results  Labs (all labs ordered are listed, but only abnormal results are displayed) Labs Reviewed - No data to display  EKG   Radiology DG Chest 2 View  Result Date: 05/10/2022 CLINICAL DATA:  Upper and lower back pain radiating around to the chest EXAM: CHEST - 2 VIEW COMPARISON:  CT chest 04/13/2022 and chest radiograph 10/23/2008 FINDINGS: Tapering of the peripheral pulmonary vasculature favors emphysema. Atherosclerotic calcification of the aortic arch. Heart size within normal limits. No blunting of the costophrenic angles. The lungs appear otherwise clear. Thoracic spondylosis. IMPRESSION: 1. No acute  findings. 2. Thoracic spondylosis. 3. Aortic Atherosclerosis (ICD10-I70.0) and Emphysema (ICD10-J43.9). Electronically Signed   By: Van Clines M.D.   On: 05/10/2022 15:59   DG Lumbar Spine 2-3 Views  Result Date: 05/10/2022 CLINICAL DATA:  Back pain and abdominal pain with burning sensation in the chest EXAM: LUMBAR SPINE - 2-3 VIEW COMPARISON:  CT abdomen 10/20/2019 FINDINGS: Atherosclerosis is present, including aortoiliac atherosclerotic disease. Degenerative facet arthropathy at L4-5 and L5-S1. 3 mm degenerative anterolisthesis at L4-5, not changed from 10/20/2019. No discrete fracture or acute bony findings. IMPRESSION: 1. Lower lumbar degenerative facet arthropathy with mild degenerative anterolisthesis at L4-5. 2.  Aortic Atherosclerosis (ICD10-I70.0). 3. No acute findings. Electronically Signed   By: Van Clines M.D.   On: 05/10/2022 15:57   DG MOBILE BONE DENSITY  Result Date: 05/10/2022 EXAM: DUAL X-RAY ABSORPTIOMETRY (DXA) FOR BONE MINERAL DENSITY 05/09/2022 3:32 pm CLINICAL DATA:  67 year old Female Postmenopausal. ESTROGEN DEF TECHNIQUE: An axial (e.g., hips, spine) and/or appendicular (e.g., radius) exam was performed, as appropriate, using Clinical research associate at [facility]. Images are obtained for bone mineral density measurement and are not obtained for diagnostic purposes. [id number] Exclusions: Lumbar L1 and L4. COMPARISON:  None. FINDINGS: Scan quality: Good. LUMBAR SPINE (L2-L3): BMD (in g/cm2): 0.732 T-score: -3.9 Z-score: -1.9 LEFT FEMORAL NECK: BMD (in g/cm2): 0.536 T-score: -2.9 Z-score: -1.6 LEFT TOTAL HIP: BMD (in g/cm2): 0.691 T-score: -2.2 Z-score: -1.2 LEFT FOREARM (RADIUS 33%): BMD (in g/cm2): 0.504 T-score: -3.2 Z-score: -1.5 FRAX 10-YEAR PROBABILITY OF FRACTURE: Patient  does not meet criteria for FRAX assessment. IMPRESSION: Osteoporosis based on BMD. Fracture risk is increased. Increased risk is based on low BMD. RECOMMENDATIONS: 1. All  patients should optimize calcium and vitamin D intake. 2. Consider FDA-approved medical therapies in postmenopausal women and men aged 74 years and older, based on the following: - A hip or vertebral (clinical or morphometric) fracture - T-score less than or equal to -2.5 and secondary causes have been excluded. - Low bone mass (T-score between -1.0 and -2.5) and a 10-year probability of a hip fracture greater than or equal to 3% or a 10-year probability of a major osteoporosis-related fracture greater than or equal to 20% based on the [place]-adapted WHO algorithm. - Clinician judgment and/or patient preferences may indicate treatment for people with 10-year fracture probabilities above or below these levels 3. Patients with diagnosis of osteoporosis or at high risk for fracture should have regular bone mineral density tests. For patients eligible for Medicare, routine testing is allowed once every 2 years. The testing frequency can be increased to one year for patients who have rapidly progressing disease, those who are receiving or discontinuing medical therapy to restore bone mass, or have additional risk factors. Electronically Signed   By: Zerita Boers M.D.   On: 05/10/2022 09:33    Procedures Procedures (including critical care time)  Medications Ordered in UC Medications - No data to display  Initial Impression / Assessment and Plan / UC Course  I have reviewed the triage vital signs and the nursing notes.  Pertinent labs & imaging results that were available during my care of the patient were reviewed by me and considered in my medical decision making (see chart for details).      X-rays show arthritic changes in the thoracic spine and lumbar spine.  There are also signs of emphysema and atherosclerosis.  She declines my offer of a Toradol shot.  Her EGFR was normal when last done, so ibuprofen is sent in to take for a few days and muscle relaxer is sent.  Asked her to follow-up with her  primary care   Final Clinical Impressions(s) / UC Diagnoses   Final diagnoses:  Acute bilateral low back pain without sciatica  Upper back pain  Chest pain, unspecified type     Discharge Instructions      I showed some arthritis in your spine.  There were no masses in your chest  Take ibuprofen 600 mg--1 tab every 8 hours as needed for pain.  Take baclofen 10 mg--1 tablet every 8 hours as needed for muscle spasm or muscle pain.  This medication could cause drowsiness or dizziness  He is make a follow-up appointment with your primary care for follow-up this issue      ED Prescriptions     Medication Sig Dispense Auth. Provider   ibuprofen (ADVIL) 600 MG tablet Take 1 tablet (600 mg total) by mouth every 8 (eight) hours as needed (pain). 15 tablet Zannah Melucci, Gwenlyn Perking, MD   baclofen (LIORESAL) 10 MG tablet Take 1 tablet (10 mg total) by mouth every 8 (eight) hours as needed for muscle spasms. 15 each Barrett Henle, MD      PDMP not reviewed this encounter.   Barrett Henle, MD 05/10/22 [phone]

## 2022-05-10 NOTE — ED Triage Notes (Signed)
Pt is here for back pain, abdominal pain, burning sensation in the chest x 1wk

## 2022-05-11 ENCOUNTER — Ambulatory Visit (HOSPITAL_COMMUNITY): Payer: Medicare PPO

## 2022-06-03 ENCOUNTER — Other Ambulatory Visit: Payer: Self-pay | Admitting: Family Medicine

## 2022-06-03 DIAGNOSIS — I10 Essential (primary) hypertension: Secondary | ICD-10-CM

## 2022-09-12 ENCOUNTER — Other Ambulatory Visit: Payer: Self-pay | Admitting: Family Medicine

## 2022-09-12 DIAGNOSIS — I10 Essential (primary) hypertension: Secondary | ICD-10-CM

## 2023-05-09 ENCOUNTER — Other Ambulatory Visit: Payer: Self-pay | Admitting: Registered Nurse

## 2023-05-09 DIAGNOSIS — Z0000 Encounter for general adult medical examination without abnormal findings: Secondary | ICD-10-CM

## 2023-05-16 ENCOUNTER — Ambulatory Visit: Payer: Medicare PPO

## 2023-06-01 ENCOUNTER — Ambulatory Visit
Admission: RE | Admit: 2023-06-01 | Discharge: 2023-06-01 | Disposition: A | Discharge status: Home / Self Care | Payer: Medicare HMO | Source: Ambulatory Visit | Attending: Registered Nurse | Admitting: Registered Nurse

## 2023-06-01 DIAGNOSIS — Z0000 Encounter for general adult medical examination without abnormal findings: Secondary | ICD-10-CM
